# Patient Record
Sex: Female | Born: 1995
Health system: Southern US, Community
[De-identification: ages and names within clinical notes are randomized; demographics above are authoritative.]

## PROBLEM LIST (undated history)

## (undated) DIAGNOSIS — N946 Dysmenorrhea, unspecified: Secondary | ICD-10-CM

## (undated) HISTORY — PX: HERNIA REPAIR: SHX51

## (undated) HISTORY — DX: Dysmenorrhea, unspecified: N94.6

## (undated) HISTORY — PX: TONSILLECTOMY: SUR1361

---
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
## 2015-02-14 ENCOUNTER — Emergency Department (HOSPITAL_BASED_OUTPATIENT_CLINIC_OR_DEPARTMENT_OTHER)
Admission: EM | Admit: 2015-02-14 | Discharge: 2015-02-14 | Disposition: A | Discharge status: Home / Self Care | Attending: Emergency Medicine | Admitting: Emergency Medicine

## 2015-02-14 ENCOUNTER — Encounter (HOSPITAL_BASED_OUTPATIENT_CLINIC_OR_DEPARTMENT_OTHER): Payer: Self-pay | Admitting: *Deleted

## 2015-02-14 ENCOUNTER — Emergency Department (HOSPITAL_BASED_OUTPATIENT_CLINIC_OR_DEPARTMENT_OTHER)

## 2015-02-14 DIAGNOSIS — Z79899 Other long term (current) drug therapy: Secondary | ICD-10-CM | POA: Insufficient documentation

## 2015-02-14 DIAGNOSIS — S60221A Contusion of right hand, initial encounter: Secondary | ICD-10-CM | POA: Insufficient documentation

## 2015-02-14 DIAGNOSIS — Y9389 Activity, other specified: Secondary | ICD-10-CM | POA: Insufficient documentation

## 2015-02-14 DIAGNOSIS — Y92219 Unspecified school as the place of occurrence of the external cause: Secondary | ICD-10-CM | POA: Insufficient documentation

## 2015-02-14 DIAGNOSIS — W2203XA Walked into furniture, initial encounter: Secondary | ICD-10-CM | POA: Insufficient documentation

## 2015-02-14 DIAGNOSIS — Y998 Other external cause status: Secondary | ICD-10-CM | POA: Insufficient documentation

## 2015-02-14 DIAGNOSIS — S6991XA Unspecified injury of right wrist, hand and finger(s), initial encounter: Secondary | ICD-10-CM | POA: Diagnosis present

## 2015-02-14 NOTE — ED Notes (Signed)
Pt reports she hit right hand on desk yesterday while at school then fell on same hand last night while playing basketball

## 2015-02-14 NOTE — ED Notes (Signed)
Pt given ice pack and note for school

## 2015-02-14 NOTE — Discharge Instructions (Signed)
Contusion °A contusion is a deep bruise. Contusions are the result of an injury that caused bleeding under the skin. The contusion may turn blue, purple, or yellow. Minor injuries will give you a painless contusion, but more severe contusions may stay painful and swollen for a few weeks.  °CAUSES  °A contusion is usually caused by a blow, trauma, or direct force to an area of the body. °SYMPTOMS  °· Swelling and redness of the injured area. °· Bruising of the injured area. °· Tenderness and soreness of the injured area. °· Pain. °DIAGNOSIS  °The diagnosis can be made by taking a history and physical exam. An X-ray, CT scan, or MRI may be needed to determine if there were any associated injuries, such as fractures. °TREATMENT  °Specific treatment will depend on what area of the body was injured. In general, the best treatment for a contusion is resting, icing, elevating, and applying cold compresses to the injured area. Over-the-counter medicines may also be recommended for pain control. Ask your caregiver what the best treatment is for your contusion. °HOME CARE INSTRUCTIONS  °· Put ice on the injured area. °¨ Put ice in a plastic bag. °¨ Place a towel between your skin and the bag. °¨ Leave the ice on for 15-20 minutes, 3-4 times a day, or as directed by your health care provider. °· Only take over-the-counter or prescription medicines for pain, discomfort, or fever as directed by your caregiver. Your caregiver may recommend avoiding anti-inflammatory medicines (aspirin, ibuprofen, and naproxen) for 48 hours because these medicines may increase bruising. °· Rest the injured area. °· If possible, elevate the injured area to reduce swelling. °SEEK IMMEDIATE MEDICAL CARE IF:  °· You have increased bruising or swelling. °· You have pain that is getting worse. °· Your swelling or pain is not relieved with medicines. °MAKE SURE YOU:  °· Understand these instructions. °· Will watch your condition. °· Will get help right  away if you are not doing well or get worse. °Document Released: 07/30/2005 Document Revised: 10/25/2013 Document Reviewed: 08/25/2011 °ExitCare® Patient Information ©2015 [employer]. This information is not intended to replace advice given to you by your health care provider. Make sure you discuss any questions you have with your health care provider. ° °

## 2015-02-14 NOTE — ED Provider Notes (Signed)
CSN: [id number]     Arrival date & time 02/14/15  [id number] History  This chart was scribed for Glynn OctaveStephen Corbett Moulder, MD by Leone PayorSonum Patel, ED Scribe. This patient was seen in room MH01/MH01 and the patient's care was started 9:43 AM.    Chief Complaint  Patient presents with  . Hand Pain    HPI   HPI Comments: Cynthia Horton is a 19 y.o. female who presents to the Emergency Department complaining of constant, gradually worsened right hand pain that began yesterday. Patient states she hit her right hand on a desk at school and then fell on the same hand while playing basketball. She states the pain is worse with movement of the fingers. She has taken OTC pain medication without relief. She denies numbness, weakness, paresthesias, or any other injuries or pain at this time.   History reviewed. No pertinent past medical history. Past Surgical History  Procedure Laterality Date  . Hernia repair    . Tonsillectomy     Family History  Problem Relation Age of Onset  . Hypertension Mother   . Cancer Maternal Grandmother   . Breast cancer Maternal Grandmother   . Hypertension Maternal Grandfather   . Diabetes Maternal Grandfather   . Cancer Paternal Grandmother   . Breast cancer Paternal Grandmother    History  Substance Use Topics  . Smoking status: Never Smoker   . Smokeless tobacco: Never Used  . Alcohol Use: No   OB History    No data available     Review of Systems  A complete 10 system review of systems was obtained and all systems are negative except as noted in the HPI and PMH.    Allergies  Naproxen  Home Medications   Prior to Admission medications   Medication Sig Start Date End Date Taking? Authorizing Provider  albuterol (VENTOLIN HFA) 108 (90 BASE) MCG/ACT inhaler 2 inhalations prior to exercise 10/02/14  Yes Kendrick Rancheborah D Schoenhoff, MD  ibuprofen (ADVIL,MOTRIN) 800 MG tablet Take one tablet every 8 hours for painful menses 12/07/14  Yes Kendrick Rancheborah D Schoenhoff, MD   Norgestimate-Ethinyl Estradiol Triphasic 0.18/0.215/0.25 MG-25 MCG tab Take 1 tablet by mouth daily. 12/07/14  Yes Kendrick Rancheborah D Schoenhoff, MD  Levonorgestrel-Ethinyl Estradiol (AMETHIA,CAMRESE) 0.15-0.03 &0.01 MG tablet Take 1 tablet by mouth daily.    Historical Provider, MD   BP 111/72 mmHg  Pulse 99  Temp(Src) 98.2 F (36.8 C) (Oral)  Resp 18  Ht 5\' 7"  (1.702 m)  Wt 120 lb (54.432 kg)  BMI 18.79 kg/m2  SpO2 100%  LMP 02/04/2015 Physical Exam  Constitutional: She is oriented to person, place, and time. She appears well-developed and well-nourished. No distress.  HENT:  Head: Normocephalic and atraumatic.  Mouth/Throat: Oropharynx is clear and moist. No oropharyngeal exudate.  Eyes: Conjunctivae and EOM are normal. Pupils are equal, round, and reactive to light.  Neck: Normal range of motion. Neck supple.  No meningismus.  Cardiovascular: Normal rate, regular rhythm, normal heart sounds and intact distal pulses.   No murmur heard. Pulmonary/Chest: Effort normal and breath sounds normal. No respiratory distress.  Abdominal: Soft. There is no tenderness. There is no rebound and no guarding.  Musculoskeletal: Normal range of motion. She exhibits no edema or tenderness.  Right hand: Tenderness to the right 4th and 5th metacarpal. No deformity. Full ROM of all fingers at MCP, DIP, and PIP. No snuffbox tenderness. Intact radial pulse.   Neurological: She is alert and oriented to person, place, and time. No cranial nerve  deficit. She exhibits normal muscle tone. Coordination normal.  No ataxia on finger to nose bilaterally. No pronator drift. 5/5 strength throughout. CN 2-12 intact. Negative Romberg. Equal grip strength. Sensation intact. Gait is normal.   Skin: Skin is warm.  Psychiatric: She has a normal mood and affect. Her behavior is normal.  Nursing note and vitals reviewed.   ED Course  Procedures (including critical care time)  DIAGNOSTIC STUDIES: Oxygen Saturation is 100% on  RA, normal by my interpretation.    COORDINATION OF CARE: 9:50 AM Will order XRAY of right hand. Discussed treatment plan with pt at bedside and pt agreed to plan.  9:58 AM Discussed imaging results with patient; will discharge home   Labs Review Labs Reviewed - No data to display  Imaging Review Dg Hand Complete Right  02/14/2015   CLINICAL DATA:  Hand pain, hit hand on  desk yesterday at school  EXAM: RIGHT HAND - COMPLETE 3+ VIEW  COMPARISON:  None.  FINDINGS: Three views of the right hand submitted. No acute fracture or subluxation. No radiopaque foreign body.  IMPRESSION: Negative.   Electronically Signed   By: Natasha Mead M.D.   On: 02/14/2015 09:49     EKG Interpretation None      MDM   Final diagnoses:  Hand contusion, right, initial encounter   Pain to right lateral hand after hitting it on a desk yesterday. No weakness, numbness or tingling.  No deformity. Neurovascular intact. No open wounds. X-ray negative for fracture.  Discussed NSAIDs and supportive care with patient.   I personally performed the services described in this documentation, which was scribed in my presence. The recorded information has been reviewed and is accurate.    Glynn Octave, MD 02/14/15 1030

## 2015-03-01 ENCOUNTER — Telehealth: Payer: Self-pay | Admitting: *Deleted

## 2015-03-01 ENCOUNTER — Encounter: Payer: Self-pay | Admitting: Internal Medicine

## 2015-03-01 ENCOUNTER — Ambulatory Visit (INDEPENDENT_AMBULATORY_CARE_PROVIDER_SITE_OTHER): Admitting: Internal Medicine

## 2015-03-01 ENCOUNTER — Telehealth: Payer: Self-pay | Admitting: Internal Medicine

## 2015-03-01 VITALS — BP 95/61 | HR 73 | Resp 16 | Ht 66.0 in | Wt 118.0 lb

## 2015-03-01 DIAGNOSIS — N946 Dysmenorrhea, unspecified: Secondary | ICD-10-CM | POA: Diagnosis not present

## 2015-03-01 MED ORDER — LEVONORGEST-ETH ESTRAD 91-DAY 0.15-0.03 &0.01 MG PO TABS
1.0000 | ORAL_TABLET | Freq: Every day | ORAL | Status: DC
Start: 2015-03-01 — End: 2015-05-08

## 2015-03-01 NOTE — Progress Notes (Signed)
Subjective:    Patient ID: Cynthia Horton, female    DOB: 03/31/1996, 19 y.o.   MRN: [id number]  HPI 12/07/2014 note Assessment & Plan:  Menorrhagia Sounds as if pt not allowing herself to have a cycle. Will check CBC TSH today Refer to [facility]  Dysmenorrhea Will start low dose TRi-sprintec . Mother and pt educated risk/benefit including slight risk of DVT. Risk/Benefit handout given to pt. For now advised to have a monthly cycle until evaluated by GYN See me in 3 months  Advised if any leg swelling or calf pain to call office and will need ultrasound to evaluate          TODAY  Cynthia Horton is here for follow up.  She will be graduating and will start at [employer] in the fall   States she still has significant cramps on Tri-Sprintec  She has not been to GYN as yet although we have schedule this for her twice.   She would like to go back to [place].     She is a non-smoker   Allergies  Allergen Reactions  . Naproxen    No past medical history on file. Past Surgical History  Procedure Laterality Date  . Hernia repair    . Tonsillectomy     History   Social History  . Marital Status: Single    Spouse Name: N/A  . Number of Children: N/A  . Years of Education: N/A   Occupational History  . Not on file.   Social History Main Topics  . Smoking status: Never Smoker   . Smokeless tobacco: Never Used  . Alcohol Use: No  . Drug Use: No  . Sexual Activity: No   Other Topics Concern  . Not on file   Social History Narrative   Family History  Problem Relation Age of Onset  . Hypertension Mother   . Cancer Maternal Grandmother   . Breast cancer Maternal Grandmother   . Hypertension Maternal Grandfather   . Diabetes Maternal Grandfather   . Cancer Paternal Grandmother   . Breast cancer Paternal Grandmother    Patient Active Problem List   Diagnosis Date Noted  . Exercise-induced asthma 10/02/2014  . Dysmenorrhea 10/02/2014  . Anemia  10/02/2014   Current Outpatient Prescriptions on File Prior to Visit  Medication Sig Dispense Refill  . albuterol (VENTOLIN HFA) 108 (90 BASE) MCG/ACT inhaler 2 inhalations prior to exercise 1 Inhaler 1  . ibuprofen (ADVIL,MOTRIN) 800 MG tablet Take one tablet every 8 hours for painful menses 30 tablet 0  . Levonorgestrel-Ethinyl Estradiol (AMETHIA,CAMRESE) 0.15-0.03 &0.01 MG tablet Take 1 tablet by mouth daily.    . Norgestimate-Ethinyl Estradiol Triphasic 0.18/0.215/0.25 MG-25 MCG tab Take 1 tablet by mouth daily. 1 Package 4   No current facility-administered medications on file prior to visit.       Review of Systems    see hpi Objective:   Physical Exam  Physical Exam  Nursing note and vitals reviewed.  Constitutional: She is oriented to person, place, and time. She appears well-developed and well-nourished.  HENT:  Head: Normocephalic and atraumatic.  Cardiovascular: Normal rate and regular rhythm. Exam reveals no gallop and no friction rub.  No murmur heard.  Pulmonary/Chest: Breath sounds normal. She has no wheezes. She has no rales.  Neurological: She is alert and oriented to person, place, and time.  Skin: Skin is warm and dry.  Psychiatric: She has a normal mood and affect. Her behavior is normal.  Assessment & Plan:  Dysmenorrhea  /   Ok to go back to [employer].  ADvised safer sex practices    Advised pt to see gyn before moving to college .   Not clear why appt was not kept   Advised not to smoke Counseled if any leg swelling to go to urgent care.    She voices understanding  Pt has received letter of my departure and advised to make new appt with her GYN and to make appt with PCP of choice

## 2015-03-01 NOTE — Telephone Encounter (Signed)
Patient has an appointment with Dr. Loreta AveWagner at [facility] on 03/15/15 @ 3:30pm-eh

## 2015-03-01 NOTE — Telephone Encounter (Signed)
Please call pts mother Cynthia JanskyKathy Horton and advise her that Cynthia Horton is advised to see the GYN at [facility] prior to her moving away for college.    Document why they missed appt with GYn and give her phone number to [facility]

## 2015-05-08 ENCOUNTER — Other Ambulatory Visit: Payer: Self-pay | Admitting: Family Medicine

## 2015-05-08 MED ORDER — LEVONORGEST-ETH ESTRAD 91-DAY 0.15-0.03 &0.01 MG PO TABS: 1.0000 | ORAL_TABLET | Freq: Every day | ORAL | Status: DC

## 2015-05-08 NOTE — Telephone Encounter (Signed)
Refill sent to [facility]. Mother notified.

## 2015-05-08 NOTE — Telephone Encounter (Signed)
Pt transferring care from Dr. Constance GoltzSchoenhoff to Dr. Laury AxonLowne. She is out of birth control. Mother called requesting refill Levonorgestrel-Ethinyl Estradiol (AMETHIA,CAMRESE) 0.15-0.03 &0.01 MG tablet.  Pt is scheduled for new pt appt 05/21/15 9:30am.

## 2015-05-18 ENCOUNTER — Telehealth: Payer: Self-pay | Admitting: *Deleted

## 2015-05-18 NOTE — Telephone Encounter (Signed)
Unable to reach patient at time of Pre-Visit Call.  Left message for patient to return call when available.    

## 2015-05-21 ENCOUNTER — Other Ambulatory Visit (HOSPITAL_BASED_OUTPATIENT_CLINIC_OR_DEPARTMENT_OTHER): Payer: Self-pay | Admitting: Physician Assistant

## 2015-05-21 ENCOUNTER — Other Ambulatory Visit: Payer: Self-pay

## 2015-05-21 ENCOUNTER — Encounter: Payer: Self-pay | Admitting: Family Medicine

## 2015-05-21 ENCOUNTER — Other Ambulatory Visit: Payer: Self-pay | Admitting: Family Medicine

## 2015-05-21 ENCOUNTER — Ambulatory Visit (HOSPITAL_BASED_OUTPATIENT_CLINIC_OR_DEPARTMENT_OTHER)
Admission: RE | Admit: 2015-05-21 | Discharge: 2015-05-21 | Disposition: A | Discharge status: Home / Self Care | Source: Ambulatory Visit | Attending: Family Medicine | Admitting: Family Medicine

## 2015-05-21 ENCOUNTER — Ambulatory Visit (INDEPENDENT_AMBULATORY_CARE_PROVIDER_SITE_OTHER): Admitting: Family Medicine

## 2015-05-21 VITALS — BP 105/69 | HR 73 | Temp 97.4°F | Ht 67.0 in | Wt 121.4 lb

## 2015-05-21 DIAGNOSIS — R519 Headache, unspecified: Secondary | ICD-10-CM

## 2015-05-21 DIAGNOSIS — S0990XA Unspecified injury of head, initial encounter: Secondary | ICD-10-CM

## 2015-05-21 DIAGNOSIS — R51 Headache: Principal | ICD-10-CM

## 2015-05-21 DIAGNOSIS — Z30011 Encounter for initial prescription of contraceptive pills: Secondary | ICD-10-CM | POA: Diagnosis not present

## 2015-05-21 DIAGNOSIS — R42 Dizziness and giddiness: Secondary | ICD-10-CM | POA: Insufficient documentation

## 2015-05-21 DIAGNOSIS — G44319 Acute post-traumatic headache, not intractable: Secondary | ICD-10-CM | POA: Diagnosis not present

## 2015-05-21 DIAGNOSIS — I82401 Acute embolism and thrombosis of unspecified deep veins of right lower extremity: Secondary | ICD-10-CM

## 2015-05-21 LAB — POCT URINE PREGNANCY: PREG TEST UR: NEGATIVE

## 2015-05-21 MED ORDER — LEVONORGEST-ETH ESTRAD 91-DAY 0.15-0.03 &0.01 MG PO TABS: 1.0000 | ORAL_TABLET | Freq: Every day | ORAL | Status: DC

## 2015-05-21 NOTE — Progress Notes (Signed)
Pre visit review using our clinic review tool, if applicable. No additional management support is needed unless otherwise documented below in the visit note. 

## 2015-05-21 NOTE — Progress Notes (Signed)
Patient ID: Cynthia Horton, female    DOB: [birth year]  Age: 19 y.o. MRN: [id number]    Subjective:  Subjective HPI Cynthia Horton presents to establish and c/o hitting head about 2 weeks ago and has had headache since .  No loc. No NV. Headaches daily. Pt would also like to go back on her old bcp.  She does not like new one at all.  Review of Systems  Constitutional: Negative for diaphoresis, appetite change, fatigue and unexpected weight change.  Eyes: Negative for photophobia, pain, redness and visual disturbance.  Respiratory: Negative for cough, chest tightness, shortness of breath and wheezing.   Cardiovascular: Negative for chest pain, palpitations and leg swelling.  Endocrine: Negative for cold intolerance, heat intolerance, polydipsia, polyphagia and polyuria.  Genitourinary: Negative for dysuria, frequency and difficulty urinating.  Neurological: Positive for headaches. Negative for dizziness, tremors, syncope, weakness, light-headedness and numbness.  Psychiatric/Behavioral: Negative for decreased concentration. The patient is not nervous/anxious.     History Past Medical History  Diagnosis Date  . Dysmenorrhea in adolescent     She has past surgical history that includes Hernia repair and Tonsillectomy.   Her family history includes Breast cancer in her maternal grandmother and paternal grandmother; Diabetes in her maternal grandfather; Hypertension in her maternal grandfather and mother; Lung cancer in her paternal grandmother.She reports that she has never smoked. She has never used smokeless tobacco. She reports that she does not drink alcohol or use illicit drugs.  Current Outpatient Prescriptions on File Prior to Visit  Medication Sig Dispense Refill  . albuterol (VENTOLIN HFA) 108 (90 BASE) MCG/ACT inhaler 2 inhalations prior to exercise 1 Inhaler 1  . ibuprofen (ADVIL,MOTRIN) 800 MG tablet Take one tablet every 8 hours for painful menses 30 tablet 0   No  current facility-administered medications on file prior to visit.     Objective:  Objective Physical Exam  Constitutional: She is oriented to person, place, and time. She appears well-developed and well-nourished.  HENT:  Head: Normocephalic and atraumatic.  Eyes: Conjunctivae and EOM are normal.  Neck: Normal range of motion. Neck supple. No JVD present. Carotid bruit is not present. No thyromegaly present.  Cardiovascular: Normal rate, regular rhythm and normal heart sounds.   No murmur heard. Pulmonary/Chest: Effort normal and breath sounds normal. No respiratory distress. She has no wheezes. She has no rales. She exhibits no tenderness.  Musculoskeletal: She exhibits no edema.  Neurological: She is alert and oriented to person, place, and time.  Psychiatric: She has a normal mood and affect. Her behavior is normal. Judgment and thought content normal.   BP 105/69 mmHg  Pulse 73  Temp(Src) 97.4 F (36.3 C) (Oral)  Ht  (1.702 m)  Wt 121 lb 6.4 oz (55.067 kg)  BMI 19.01 kg/m2  SpO2 98%  LMP 05/17/2015 (Exact Date) Wt Readings from Last 3 Encounters:  05/21/15 121 lb 6.4 oz (55.067 kg) (41 %*, Z = -0.22)  03/01/15 118 lb (53.524 kg) (35 %*, Z = -0.38)  02/14/15 120 lb (54.432 kg) (40 %*, Z = -0.26)   * Growth percentiles are based on CDC 2-20 Years data.     Lab Results  Component Value Date   WBC 9.5 12/07/2014   HGB 14.1 12/07/2014   HCT 43.0 12/07/2014   PLT 283 12/07/2014   GLUCOSE 95 10/20/2014   CHOL 97 10/02/2014   TRIG 39 10/02/2014   HDL 49 10/02/2014   LDLCALC 40 10/02/2014   ALT 13 10/02/2014  AST 25 10/02/2014   NA 137 10/20/2014   K 3.8 10/20/2014   CL 100 10/20/2014   CREATININE 0.90 10/20/2014   BUN 13 10/20/2014   CO2 24 10/20/2014   TSH CANCELED 10/02/2014    Dg Hand Complete Right  02/14/2015   CLINICAL DATA:  Hand pain, hit hand on  desk yesterday at school  EXAM: RIGHT HAND - COMPLETE 3+ VIEW  COMPARISON:  None.  FINDINGS: Three  views of the right hand submitted. No acute fracture or subluxation. No radiopaque foreign body.  IMPRESSION: Negative.   Electronically Signed   By: Natasha MeadLiviu  Pop M.D.   On: 02/14/2015 09:49     Assessment & Plan:  Plan I have discontinued Ms. Derden's Levonorgestrel-Ethinyl Estradiol. I am also having her start on Levonorgestrel-Ethinyl Estradiol. Additionally, I am having her maintain her albuterol and ibuprofen.  Meds ordered this encounter  Medications  . Levonorgestrel-Ethinyl Estradiol (AMETHIA,CAMRESE) 0.15-0.03 &0.01 MG tablet    Sig: Take 1 tablet by mouth daily.    Dispense:  1 Package    Refill:  4    Problem List Items Addressed This Visit    None    Visit Diagnoses    Encounter for initial prescription of contraceptive pills    -  Primary    Relevant Medications    Levonorgestrel-Ethinyl Estradiol (AMETHIA,CAMRESE) 0.15-0.03 &0.01 MG tablet    Head injury, initial encounter        Relevant Orders    POCT urine pregnancy (Completed)    Acute post-traumatic headache, not intractable        Relevant Orders    Ambulatory referral to Neurology       Follow-up: No Follow-up on file.  Cynthia FreudYvonne Lowne, DO

## 2015-05-21 NOTE — Patient Instructions (Signed)
Traumatic Brain Injury Traumatic brain injury (TBI) occurs when an injury to the head causes the brain to move back and forth. The risk of brain injury varies with the severity of the trauma. The damage can be confined to one area of the brain (focal) or involve different areas of the brain (diffuse). The severity of a brain injury can range from:  A blow or jolt to the head that disrupts the normal function of the brain (concussion).  A deep state of unconsciousness (coma).  Death. CAUSES  A brain injury can result from:  A closed head injury. This occurs when the head suddenly and violently hits an object but the object does not break through the skull. Examples include:  A direct blow (hitting your head on a hard surface).  An indirect blow (when your head moves rapidly and violently back and forth, like in a car crash). This injury is called contrecoup (involving a blow and counter blow). Shaken baby syndrome is a severe type of this injury. It happens when a baby is shaken forcibly enough to cause extreme contrecoup injury.  Penetrating head injury. A penetrating head injury occurs when an object pierces the skull and enters the brain tissue. Examples include:  A skull fracture occurs when the skull cracks or breaks.  A depressed skull fracture occurs when pieces of the broken skull press into the tissue of the brain. This can cause bruising of the brain tissue called a contusion. In both closed and penetrating head injuries, damage to blood vessels can cause heavy bleeding into or around the brain.  SYMPTOMS  The symptoms of a TBI depend on the type and severity of the injury. The most common symptoms include:   Confusion (disorientation) or other thinking problems.  An inability to remember events around the time of the injury (amnesia).  Difficulty staying awake or passing out (loss of consciousness).  Difficulty maintaining your balance or feeling unsteady.  Slow reaction  time.  Difficulty learning or remembering things you have heard.  Headache.  Blurry vision.  Vomiting.  Seizures.  Swelling of the scalp. This occurs because of bleeding or swelling under the skin of the skull when the head is hit. TREATMENT Treatment of traumatic brain injury can involve a range of different medical options:  If a brain injury is moderate to severe, a hospital stay will be necessary to monitor:  Neurological status.  Pressure or swelling of the brain (intracranial pressure).  For seizures.  Severe brain injury cases may need surgery to:  Control bleeding.  Relieve pressure on the brain.  Remove objects from the brain that result from a penetrating injury.  Repair the skull from an injury.  Long-term treatment of a brain injury can involve rehabilitation work such as:  Physical therapy.  Occupational therapy.  Speech therapy. PROGNOSIS The outcome of TBI depends on the cause of the injury, location, severity, and extent of neurological damage. Outcomes range from good recovery to death. Long term consequences of a TBI can include:  Difficulty concentrating or having a short attention span.  Change in personality.  Irritability.  Headaches.  Blurry vision.  Sleepiness.  Depression.  Unsteadiness that makes walking or standing hard to do. For more information and support, contact: The National Institute of Neurological Disorders and Stroke.  Document Released: 10/10/2002 Document Revised: 08/10/2013 Document Reviewed: 02/02/2014 [employer] Patient Information 2015 [employer]. This information is not intended to replace advice given to you by your health care provider. Make sure   you discuss any questions you have with your health care provider.  

## 2015-06-07 ENCOUNTER — Emergency Department (HOSPITAL_BASED_OUTPATIENT_CLINIC_OR_DEPARTMENT_OTHER)

## 2015-06-07 ENCOUNTER — Emergency Department (HOSPITAL_BASED_OUTPATIENT_CLINIC_OR_DEPARTMENT_OTHER)
Admission: EM | Admit: 2015-06-07 | Discharge: 2015-06-07 | Disposition: A | Discharge status: Home / Self Care | Attending: Emergency Medicine | Admitting: Emergency Medicine

## 2015-06-07 ENCOUNTER — Encounter (HOSPITAL_BASED_OUTPATIENT_CLINIC_OR_DEPARTMENT_OTHER): Payer: Self-pay | Admitting: *Deleted

## 2015-06-07 DIAGNOSIS — Z3202 Encounter for pregnancy test, result negative: Secondary | ICD-10-CM | POA: Insufficient documentation

## 2015-06-07 DIAGNOSIS — J45901 Unspecified asthma with (acute) exacerbation: Secondary | ICD-10-CM

## 2015-06-07 DIAGNOSIS — Z79899 Other long term (current) drug therapy: Secondary | ICD-10-CM | POA: Diagnosis not present

## 2015-06-07 DIAGNOSIS — Z8742 Personal history of other diseases of the female genital tract: Secondary | ICD-10-CM | POA: Diagnosis not present

## 2015-06-07 DIAGNOSIS — R05 Cough: Secondary | ICD-10-CM

## 2015-06-07 DIAGNOSIS — R0981 Nasal congestion: Secondary | ICD-10-CM

## 2015-06-07 DIAGNOSIS — R059 Cough, unspecified: Secondary | ICD-10-CM

## 2015-06-07 LAB — PREGNANCY, URINE: PREG TEST UR: NEGATIVE

## 2015-06-07 MED ORDER — BENZONATATE 100 MG PO CAPS: 100.0000 mg | ORAL_CAPSULE | Freq: Three times a day (TID) | ORAL | Status: DC | PRN

## 2015-06-07 MED ORDER — PREDNISONE 20 MG PO TABS: 60.0000 mg | ORAL_TABLET | Freq: Every day | ORAL | Status: DC

## 2015-06-07 MED ORDER — ALBUTEROL SULFATE HFA 108 (90 BASE) MCG/ACT IN AERS: 2.0000 | INHALATION_SPRAY | RESPIRATORY_TRACT | Status: DC | PRN

## 2015-06-07 MED ORDER — ACETAMINOPHEN 325 MG PO TABS
650.0000 mg | ORAL_TABLET | Freq: Once | ORAL | Status: AC
  Administered 2015-06-07: 650 mg via ORAL
  Filled 2015-06-07: qty 2

## 2015-06-07 MED ORDER — DIPHENHYDRAMINE HCL 25 MG PO CAPS
50.0000 mg | ORAL_CAPSULE | Freq: Once | ORAL | Status: AC
  Administered 2015-06-07: 50 mg via ORAL
  Filled 2015-06-07: qty 2

## 2015-06-07 MED ORDER — PREDNISONE 50 MG PO TABS
60.0000 mg | ORAL_TABLET | Freq: Once | ORAL | Status: AC
  Administered 2015-06-07: 60 mg via ORAL
  Filled 2015-06-07 (×2): qty 1

## 2015-06-07 MED ORDER — BENZONATATE 100 MG PO CAPS
100.0000 mg | ORAL_CAPSULE | Freq: Once | ORAL | Status: AC
  Administered 2015-06-07: 100 mg via ORAL
  Filled 2015-06-07: qty 1

## 2015-06-07 NOTE — Discharge Instructions (Signed)
You may alternate between 650 mg of Tylenol every 6 hours as needed for fever and pain and 600 mg of ibuprofen every 8 hours as needed for fever and pain. You may also use Benadryl 50 mg every 8 hours as needed for nasal congestion and to help with sleep. Please rest, drink plenty of fluids. I do not feel antibiotics will be helpful at this time - there is no sign of pneumonia or other bacterial infection. If you do not notice significant improvement after 48 hours of being on steroids, I recommend you follow-up with your primary care physician.  Asthma, Acute Bronchospasm Acute bronchospasm caused by asthma is also referred to as an asthma attack. Bronchospasm means your air passages become narrowed. The narrowing is caused by inflammation and tightening of the muscles in the air tubes (bronchi) in your lungs. This can make it hard to breathe or cause you to wheeze and cough. CAUSES Possible triggers are:  Animal dander from the skin, hair, or feathers of animals.  Dust mites contained in house dust.  Cockroaches.  Pollen from trees or grass.  Mold.  Cigarette or tobacco smoke.  Air pollutants such as dust, household cleaners, hair sprays, aerosol sprays, paint fumes, strong chemicals, or strong odors.  Cold air or weather changes. Cold air may trigger inflammation. Winds increase molds and pollens in the air.  Strong emotions such as crying or laughing hard.  Stress.  Certain medicines such as aspirin or beta-blockers.  Sulfites in foods and drinks, such as dried fruits and wine.  Infections or inflammatory conditions, such as a flu, cold, or inflammation of the nasal membranes (rhinitis).  Gastroesophageal reflux disease (GERD). GERD is a condition where stomach acid backs up into your esophagus.  Exercise or strenuous activity. SIGNS AND SYMPTOMS   Wheezing.  Excessive coughing, particularly at night.  Chest tightness.  Shortness of breath. DIAGNOSIS  Your health  care provider will ask you about your medical history and perform a physical exam. A chest X-ray or blood testing may be performed to look for other causes of your symptoms or other conditions that may have triggered your asthma attack. TREATMENT  Treatment is aimed at reducing inflammation and opening up the airways in your lungs. Most asthma attacks are treated with inhaled medicines. These include quick relief or rescue medicines (such as bronchodilators) and controller medicines (such as inhaled corticosteroids). These medicines are sometimes given through an inhaler or a nebulizer. Systemic steroid medicine taken by mouth or given through an IV tube also can be used to reduce the inflammation when an attack is moderate or severe. Antibiotic medicines are only used if a bacterial infection is present.  HOME CARE INSTRUCTIONS   Rest.  Drink plenty of liquids. This helps the mucus to remain thin and be easily coughed up. Only use caffeine in moderation and do not use alcohol until you have recovered from your illness.  Do not smoke. Avoid being exposed to secondhand smoke.  You play a critical role in keeping yourself in good health. Avoid exposure to things that cause you to wheeze or to have breathing problems.  Keep your medicines up-to-date and available. Carefully follow your health care provider's treatment plan.  Take your medicine exactly as prescribed.  When pollen or pollution is bad, keep windows closed and use an air conditioner or go to places with air conditioning.  Asthma requires careful medical care. See your health care provider for a follow-up as advised. If you are more  than [redacted] weeks pregnant and you were prescribed any new medicines, let your obstetrician know about the visit and how you are doing. Follow up with your health care provider as directed.  After you have recovered from your asthma attack, make an appointment with your outpatient doctor to talk about ways to  reduce the likelihood of future attacks. If you do not have a doctor who manages your asthma, make an appointment with a primary care doctor to discuss your asthma. SEEK IMMEDIATE MEDICAL CARE IF:   You are getting worse.  You have trouble breathing. If severe, call your local emergency services (911 in the [place]  You develop chest pain or discomfort.  You are vomiting.  You are not able to keep fluids down.  You are coughing up yellow, green, brown, or bloody sputum.  You have a fever and your symptoms suddenly get worse.  You have trouble swallowing. MAKE SURE YOU:   Understand these instructions.  Will watch your condition.  Will get help right away if you are not doing well or get worse. Document Released: 02/04/2007 Document Revised: 10/25/2013 Document Reviewed: 04/27/2013 [facility] Patient Information 2015 [place], [place]. This information is not intended to replace advice given to you by your health care provider. Make sure you discuss any questions you have with your health care provider.  Upper Respiratory Infection, Adult An upper respiratory infection (URI) is also sometimes known as the common cold. The upper respiratory tract includes the nose, sinuses, throat, trachea, and bronchi. Bronchi are the airways leading to the lungs. Most people improve within 1 week, but symptoms can last up to 2 weeks. A residual cough may last even longer.  CAUSES Many different viruses can infect the tissues lining the upper respiratory tract. The tissues become irritated and inflamed and often become very moist. Mucus production is also common. A cold is contagious. You can easily spread the virus to others by oral contact. This includes kissing, sharing a glass, coughing, or sneezing. Touching your mouth or nose and then touching a surface, which is then touched by another person, can also spread the virus. SYMPTOMS  Symptoms typically develop 1 to 3 days after you come in contact with  a cold virus. Symptoms vary from person to person. They may include:  Runny nose.  Sneezing.  Nasal congestion.  Sinus irritation.  Sore throat.  Loss of voice (laryngitis).  Cough.  Fatigue.  Muscle aches.  Loss of appetite.  Headache.  Low-grade fever. DIAGNOSIS  You might diagnose your own cold based on familiar symptoms, since most people get a cold 2 to 3 times a year. Your caregiver can confirm this based on your exam. Most importantly, your caregiver can check that your symptoms are not due to another disease such as strep throat, sinusitis, pneumonia, asthma, or epiglottitis. Blood tests, throat tests, and X-rays are not necessary to diagnose a common cold, but they may sometimes be helpful in excluding other more serious diseases. Your caregiver will decide if any further tests are required. RISKS AND COMPLICATIONS  You may be at risk for a more severe case of the common cold if you smoke cigarettes, have chronic heart disease (such as heart failure) or lung disease (such as asthma), or if you have a weakened immune system. The very young and very old are also at risk for more serious infections. Bacterial sinusitis, middle ear infections, and bacterial pneumonia can complicate the common cold. The common cold can worsen asthma and chronic obstructive  pulmonary disease (COPD). Sometimes, these complications can require emergency medical care and may be life-threatening. PREVENTION  The best way to protect against getting a cold is to practice good hygiene. Avoid oral or hand contact with people with cold symptoms. Wash your hands often if contact occurs. There is no clear evidence that vitamin C, vitamin E, echinacea, or exercise reduces the chance of developing a cold. However, it is always recommended to get plenty of rest and practice good nutrition. TREATMENT  Treatment is directed at relieving symptoms. There is no cure. Antibiotics are not effective, because the  infection is caused by a virus, not by bacteria. Treatment may include:  Increased fluid intake. Sports drinks offer valuable electrolytes, sugars, and fluids.  Breathing heated mist or steam (vaporizer or shower).  Eating chicken soup or other clear broths, and maintaining good nutrition.  Getting plenty of rest.  Using gargles or lozenges for comfort.  Controlling fevers with ibuprofen or acetaminophen as directed by your caregiver.  Increasing usage of your inhaler if you have asthma. Zinc gel and zinc lozenges, taken in the first 24 hours of the common cold, can shorten the duration and lessen the severity of symptoms. Pain medicines may help with fever, muscle aches, and throat pain. A variety of non-prescription medicines are available to treat congestion and runny nose. Your caregiver can make recommendations and may suggest nasal or lung inhalers for other symptoms.  HOME CARE INSTRUCTIONS   Only take over-the-counter or prescription medicines for pain, discomfort, or fever as directed by your caregiver.  Use a warm mist humidifier or inhale steam from a shower to increase air moisture. This may keep secretions moist and make it easier to breathe.  Drink enough water and fluids to keep your urine clear or pale yellow.  Rest as needed.  Return to work when your temperature has returned to normal or as your caregiver advises. You may need to stay home longer to avoid infecting others. You can also use a face mask and careful hand washing to prevent spread of the virus. SEEK MEDICAL CARE IF:   After the first few days, you feel you are getting worse rather than better.  You need your caregiver's advice about medicines to control symptoms.  You develop chills, worsening shortness of breath, or brown or red sputum. These may be signs of pneumonia.  You develop yellow or brown nasal discharge or pain in the face, especially when you bend forward. These may be signs of  sinusitis.  You develop a fever, swollen neck glands, pain with swallowing, or white areas in the back of your throat. These may be signs of strep throat. SEEK IMMEDIATE MEDICAL CARE IF:   You have a fever.  You develop severe or persistent headache, ear pain, sinus pain, or chest pain.  You develop wheezing, a prolonged cough, cough up blood, or have a change in your usual mucus (if you have chronic lung disease).  You develop sore muscles or a stiff neck. Document Released: 04/15/2001 Document Revised: 01/12/2012 Document Reviewed: 01/25/2014 [facility] Patient Information 2015 [place], [place]. This information is not intended to replace advice given to you by your health care provider. Make sure you discuss any questions you have with your health care provider.

## 2015-06-07 NOTE — ED Notes (Signed)
C/o cough, runny nose, congestion x 4 days

## 2015-06-07 NOTE — ED Provider Notes (Signed)
TIME SEEN: 1:20 AM  CHIEF COMPLAINT: Nasal congestion, cough, wheezing  HPI: Pt is a 19 y.o. female with history of asthma who presents to the emergency department with 4 days of nasal congestion, dry cough. No fever or chills. Mother reports patient will become short of breath because she has long coughing spells. No vomiting or diarrhea. No sick contacts or recent travel. No history of PE or DVT. She is on birth control but does not smoke. No history of lower extremity swelling or pain. No recent prolonged immobilization such as long flight, hospitalization, fracture, surgery or trauma. Mother reports she has been wheezing and have been using albuterol home with some relief. Asthma is normally triggered by exercise.  ROS: See HPI Constitutional: no fever  Eyes: no drainage  ENT: no runny nose   Cardiovascular:  no chest pain  Resp:  SOB with coughing GI: no vomiting GU: no dysuria Integumentary: no rash  Allergy: no hives  Musculoskeletal: no leg swelling  Neurological: no slurred speech ROS otherwise negative  PAST MEDICAL HISTORY/PAST SURGICAL HISTORY:  Past Medical History  Diagnosis Date  . Dysmenorrhea in adolescent     MEDICATIONS:  Prior to Admission medications   Medication Sig Start Date End Date Taking? Authorizing Provider  albuterol (VENTOLIN HFA) 108 (90 BASE) MCG/ACT inhaler 2 inhalations prior to exercise 10/02/14   Kendrick Ranch, MD  ibuprofen (ADVIL,MOTRIN) 800 MG tablet Take one tablet every 8 hours for painful menses 12/07/14   Kendrick Ranch, MD  Levonorgestrel-Ethinyl Estradiol (AMETHIA,CAMRESE) 0.15-0.03 &0.01 MG tablet Take 1 tablet by mouth daily. 05/21/15   Lelon Perla, DO    ALLERGIES:  Allergies  Allergen Reactions  . Naproxen Other (See Comments)    Chest tightness    SOCIAL HISTORY:  History  Substance Use Topics  . Smoking status: Never Smoker   . Smokeless tobacco: Never Used  . Alcohol Use: No    FAMILY HISTORY: Family  History  Problem Relation Age of Onset  . Hypertension Mother   . Breast cancer Maternal Grandmother     x's 3  . Hypertension Maternal Grandfather   . Diabetes Maternal Grandfather   . Breast cancer Paternal Grandmother   . Lung cancer Paternal Grandmother     EXAM: BP 129/87 mmHg  Pulse 93  Temp(Src) 98.4 F (36.9 C) (Oral)  Resp 20  Ht 5\' 7"  (1.702 m)  Wt 128 lb (58.06 kg)  BMI 20.04 kg/m2  SpO2 100%  LMP 05/17/2015 (Exact Date) CONSTITUTIONAL: Alert and oriented and responds appropriately to questions. Well-appearing; well-nourished afebrile, in no distress HEAD: Normocephalic EYES: Conjunctivae clear, PERRL ENT: normal nose; no rhinorrhea; moist mucous membranes; pharynx without lesions noted, clear nasal drainage noted in the posterior oropharynx, no tonsillar hypertrophy or exudate, no uvular deviation, no trismus or drooling, normal phonation, no stridor NECK: Supple, no meningismus, no LAD  CARD: RRR; S1 and S2 appreciated; no murmurs, no clicks, no rubs, no gallops; patient initially tachycardic but this was taken when patient was coughing RESP: Normal chest excursion without splinting or tachypnea; breath sounds clear and equal bilaterally; no wheezes, no rhonchi, no rales, no hypoxia or respiratory distress, speaking full sentences, has dry cough ABD/GI: Normal bowel sounds; non-distended; soft, non-tender, no rebound, no guarding, no peritoneal signs BACK:  The back appears normal and is non-tender to palpation, there is no CVA tenderness EXT: Normal ROM in all joints; non-tender to palpation; no edema; normal capillary refill; no cyanosis, no calf tenderness or  swelling    SKIN: Normal color for age and race; warm NEURO: Moves all extremities equally, sensation to light touch intact diffusely, cranial nerves II through XII intact PSYCH: The patient's mood and manner are appropriate. Grooming and personal hygiene are appropriate.  MEDICAL DECISION MAKING: Patient here  with cough, nasal congestion. There may be a component of postnasal drip given patient has clear nasal sinus drainage in her posterior oropharynx as well as bronchospasm given her history of asthma and reports a wheezing at home. Her lungs are now clear to auscultation with good aeration she is not hypoxic. She does not have any risk factors for PE other than birth control use but only feel short of breath when she is coughing heavily. No chest pain. No lower extremity swelling or pain. We'll refill her albuterol inhaler and start her on a prednisone burst. Will also discharge with Tessalon Perles to use for coughing. Have recommended alternating Tylenol and Motrin if patient does develop fever or has pain. Have recommended rest, increase fluid intake. Will provide work note. Chest x-ray today is normal without edema, pneumothorax or infiltrate. She is not pregnant. Discussed usual and customary return precautions. Patient and family at bedside verbalize understanding and are comfortable with this plan.      [name], DO 06/07/15 0221

## 2015-06-07 NOTE — ED Notes (Signed)
C/o cough, congestion and sob w cough x 4 days  No distress noted

## 2015-06-18 ENCOUNTER — Encounter: Admitting: Family Medicine

## 2015-06-18 ENCOUNTER — Telehealth: Payer: Self-pay | Admitting: Family Medicine

## 2015-06-22 NOTE — Telephone Encounter (Signed)
yes

## 2015-06-22 NOTE — Telephone Encounter (Signed)
Pt was no show 06/18/15 1:30pm, cpe appt, called and left msg to reschedule, charge for no show?

## 2015-07-12 ENCOUNTER — Encounter: Payer: Self-pay | Admitting: Neurology

## 2015-07-12 ENCOUNTER — Ambulatory Visit (INDEPENDENT_AMBULATORY_CARE_PROVIDER_SITE_OTHER): Admitting: Neurology

## 2015-07-12 VITALS — BP 104/64 | HR 93 | Ht 67.0 in | Wt 123.2 lb

## 2015-07-12 DIAGNOSIS — G44309 Post-traumatic headache, unspecified, not intractable: Secondary | ICD-10-CM | POA: Diagnosis not present

## 2015-07-12 NOTE — Progress Notes (Signed)
NEUROLOGY CONSULTATION NOTE  Cynthia Horton MRN: [id number] DOB: [birth year]  Referring provider: Dr. Laury Axon Primary care provider: Dr. Laury Axon  Reason for consult:  headache  HISTORY OF PRESENT ILLNESS: Cynthia Horton is an 19 year old right-handed female who presents for headache.  She is accompanied by her mother who provides some history.  Images of head CT reviewed.  In July, she hit the left side of her forehead, just above the the eye on a bar while on a slide at the park.  She did not lose consciousness.  Afterwards, she developed tenderness to the area.  Light touch and rubbing of her eyebrow is uncomfortable.  Occasionally, she has a headache, located in the same region, about 6/10 and lasting 5 to 10 minutes.  It occurs about once or twice a week.  There is no associated nausea, vomiting or visual disturbance.  She is photosensitive.  She doesn't take anything for the pain.  It has slowly improved but has not resolved.  She had a CT of the head performed on 05/21/15, which was normal.    She has no prior history of headache.  However, she did hit the same area of her head in 5th grade.  There is no family history of headache or cerebral aneurysm.  PAST MEDICAL HISTORY: Past Medical History  Diagnosis Date  . Dysmenorrhea in adolescent     PAST SURGICAL HISTORY: Past Surgical History  Procedure Laterality Date  . Hernia repair    . Tonsillectomy      MEDICATIONS: Current Outpatient Prescriptions on File Prior to Visit  Medication Sig Dispense Refill  . albuterol (PROVENTIL HFA;VENTOLIN HFA) 108 (90 BASE) MCG/ACT inhaler Inhale 2 puffs into the lungs every 4 (four) hours as needed for wheezing or shortness of breath. 1 Inhaler 0  . ibuprofen (ADVIL,MOTRIN) 800 MG tablet Take one tablet every 8 hours for painful menses 30 tablet 0  . Levonorgestrel-Ethinyl Estradiol (AMETHIA,CAMRESE) 0.15-0.03 &0.01 MG tablet Take 1 tablet by mouth daily. 1 Package 4   No current  facility-administered medications on file prior to visit.    ALLERGIES: Allergies  Allergen Reactions  . Naproxen Other (See Comments)    Chest tightness    FAMILY HISTORY: Family History  Problem Relation Age of Onset  . Hypertension Mother   . Breast cancer Maternal Grandmother     x's 3  . Hypertension Maternal Grandfather   . Diabetes Maternal Grandfather   . Breast cancer Paternal Grandmother   . Lung cancer Paternal Grandmother     SOCIAL HISTORY: Social History   Social History  . Marital Status: Single    Spouse Name: N/A  . Number of Children: N/A  . Years of Education: N/A   Occupational History  . Not on file.   Social History Main Topics  . Smoking status: Never Smoker   . Smokeless tobacco: Never Used  . Alcohol Use: No  . Drug Use: No  . Sexual Activity: No   Other Topics Concern  . Not on file   Social History Narrative   Lives with mother in a two story home.  No children.  Works as a Higher education careers adviser at [employer].  Education: in college.    REVIEW OF SYSTEMS: Constitutional: No fevers, chills, or sweats, no generalized fatigue, change in appetite Eyes: No visual changes, double vision, eye pain Ear, nose and throat: No hearing loss, ear pain, nasal congestion, sore throat Cardiovascular: No chest pain, palpitations Respiratory:  No shortness  of breath at rest or with exertion, wheezes GastrointestinaI: No nausea, vomiting, diarrhea, abdominal pain, fecal incontinence Genitourinary:  No dysuria, urinary retention or frequency Musculoskeletal:  No neck pain, back pain Integumentary: No rash, pruritus, skin lesions Neurological: as above Psychiatric: No depression, insomnia, anxiety Endocrine: No palpitations, fatigue, diaphoresis, mood swings, change in appetite, change in weight, increased thirst Hematologic/Lymphatic:  No anemia, purpura, petechiae. Allergic/Immunologic: no itchy/runny eyes, nasal congestion, recent allergic  reactions, rashes  PHYSICAL EXAM: Filed Vitals:   07/12/15 1333  BP: 104/64  Pulse: 93   General: No acute distress.  Patient appears well-groomed.   Head:  Normocephalic/atraumatic.  Tenderness to palpation to the left temple just above her left eye. Eyes:  fundi unremarkable, without vessel changes, exudates, hemorrhages or papilledema. Neck: supple, no paraspinal tenderness, full range of motion Back: No paraspinal tenderness Heart: regular rate and rhythm Lungs: Clear to auscultation bilaterally. Vascular: No carotid bruits. Neurological Exam: Mental status: alert and oriented to person, place, and time, recent and remote memory intact, fund of knowledge intact, attention and concentration intact, speech fluent and not dysarthric, language intact. Cranial nerves: CN I: not tested CN II: pupils equal, round and reactive to light, visual fields intact, fundi unremarkable, without vessel changes, exudates, hemorrhages or papilledema. CN III, IV, VI:  full range of motion, no nystagmus, no ptosis CN V: facial sensation intact CN VII: upper and lower face symmetric CN VIII: hearing intact CN IX, X: gag intact, uvula midline CN XI: sternocleidomastoid and trapezius muscles intact CN XII: tongue midline Bulk & Tone: normal, no fasciculations. Motor:  5/5 throughout Sensation: temperature and vibration sensation intact. Deep Tendon Reflexes:  2+ throughout, toes downgoing.  Finger to nose testing:  Without dysmetria.  Heel to shin:  Without dysmetria.  Gait:  Normal station and stride.  Able to turn and tandem walk. Romberg negative.  IMPRESSION: Post-traumatic headache.  Improving.  Neurological exam normal.   PLAN: If she felt they were frequent enough, we can start an antidepressant.  However, she is not bothered by it and it is slowly improving.  Follow up as needed.  Thank you for allowing me to take part in the care of this patient.  Shon Millet, DO  CC:  Loreen Freud,  DO

## 2015-07-12 NOTE — Patient Instructions (Signed)
CT of the head looks okay.  I think the tenderness will continue to improve with time.  Follow up as needed.

## 2016-04-14 ENCOUNTER — Ambulatory Visit (INDEPENDENT_AMBULATORY_CARE_PROVIDER_SITE_OTHER): Admitting: Family Medicine

## 2016-04-14 ENCOUNTER — Encounter: Payer: Self-pay | Admitting: Family Medicine

## 2016-04-14 VITALS — BP 90/50 | HR 86 | Temp 98.4°F | Ht 66.0 in | Wt 123.4 lb

## 2016-04-14 DIAGNOSIS — Z30011 Encounter for initial prescription of contraceptive pills: Secondary | ICD-10-CM | POA: Diagnosis not present

## 2016-04-14 MED ORDER — LEVONORGEST-ETH ESTRAD 91-DAY 0.15-0.03 &0.01 MG PO TABS: 1.0000 | ORAL_TABLET | Freq: Every day | ORAL | Status: DC

## 2016-04-14 NOTE — Progress Notes (Signed)
Pre visit review using our clinic review tool, if applicable. No additional management support is needed unless otherwise documented below in the visit note. 

## 2016-04-14 NOTE — Patient Instructions (Signed)

## 2016-04-14 NOTE — Progress Notes (Signed)
Subjective:    Patient ID: Cynthia Horton, female    DOB: 1996-09-12, 20 y.o.   MRN: [id number]  Chief Complaint  Patient presents with  . Medication check    and refill    HPI Patient is in today for f/u bcp.   No complaints.  bcp are controlling cramps.    Pt is not sexually active.     Past Medical History  Diagnosis Date  . Dysmenorrhea in adolescent     Past Surgical History  Procedure Laterality Date  . Hernia repair    . Tonsillectomy      Family History  Problem Relation Age of Onset  . Hypertension Mother   . Breast cancer Maternal Grandmother     x's 3  . Hypertension Maternal Grandfather   . Diabetes Maternal Grandfather   . Breast cancer Paternal Grandmother   . Lung cancer Paternal Grandmother     Social History   Social History  . Marital Status: Single    Spouse Name: N/A  . Number of Children: N/A  . Years of Education: N/A   Occupational History  . Not on file.   Social History Main Topics  . Smoking status: Never Smoker   . Smokeless tobacco: Never Used  . Alcohol Use: No  . Drug Use: No  . Sexual Activity: No   Other Topics Concern  . Not on file   Social History Narrative   Lives with mother in a two story home.  No children.  Works as a Higher education careers adviser at [employer].  Education: in college.    Outpatient Prescriptions Prior to Visit  Medication Sig Dispense Refill  . albuterol (PROVENTIL HFA;VENTOLIN HFA) 108 (90 BASE) MCG/ACT inhaler Inhale 2 puffs into the lungs every 4 (four) hours as needed for wheezing or shortness of breath. 1 Inhaler 0  . ibuprofen (ADVIL,MOTRIN) 800 MG tablet Take one tablet every 8 hours for painful menses 30 tablet 0  . Levonorgestrel-Ethinyl Estradiol (AMETHIA,CAMRESE) 0.15-0.03 &0.01 MG tablet Take 1 tablet by mouth daily. 1 Package 4   No facility-administered medications prior to visit.    Allergies  Allergen Reactions  . Naproxen Other (See Comments)    Chest tightness     Review of Systems  Constitutional: Negative for fever, chills and malaise/fatigue.  HENT: Negative for congestion and hearing loss.   Eyes: Negative for discharge.  Respiratory: Negative for cough, sputum production and shortness of breath.   Cardiovascular: Negative for chest pain, palpitations and leg swelling.  Gastrointestinal: Negative for heartburn, nausea, vomiting, abdominal pain, diarrhea, constipation and blood in stool.  Genitourinary: Negative for dysuria, urgency, frequency and hematuria.  Musculoskeletal: Negative for myalgias, back pain and falls.  Skin: Negative for rash.  Neurological: Negative for dizziness, sensory change, loss of consciousness, weakness and headaches.  Endo/Heme/Allergies: Negative for environmental allergies. Does not bruise/bleed easily.  Psychiatric/Behavioral: Negative for depression and suicidal ideas. The patient is not nervous/anxious and does not have insomnia.        Objective:    Physical Exam  Constitutional: She is oriented to person, place, and time. She appears well-developed and well-nourished.  HENT:  Head: Normocephalic and atraumatic.  Eyes: Conjunctivae and EOM are normal.  Neck: Normal range of motion. Neck supple. No JVD present. Carotid bruit is not present. No thyromegaly present.  Cardiovascular: Normal rate, regular rhythm and normal heart sounds.   No murmur heard. Pulmonary/Chest: Effort normal and breath sounds normal. No respiratory distress. She has no  wheezes. She has no rales. She exhibits no tenderness.  Musculoskeletal: She exhibits no edema.  Neurological: She is alert and oriented to person, place, and time.  Psychiatric: She has a normal mood and affect.  Nursing note and vitals reviewed.   BP 90/50 mmHg  Pulse 86  Temp(Src) 98.4 F (36.9 C) (Oral)  Ht 5\' 6"  (1.676 m)  Wt 123 lb 6.4 oz (55.974 kg)  BMI 19.93 kg/m2  SpO2 98%  LMP 03/27/2016 Wt Readings from Last 3 Encounters:  04/14/16 123 lb 6.4  oz (55.974 kg) (41 %*, Z = -0.22)  07/12/15 123 lb 4 oz (55.906 kg) (45 %*, Z = -0.14)  06/07/15 128 lb (58.06 kg) (54 %*, Z = 0.11)   * Growth percentiles are based on CDC 2-20 Years data.     Lab Results  Component Value Date   WBC 9.5 12/07/2014   HGB 14.1 12/07/2014   HCT 43.0 12/07/2014   PLT 283 12/07/2014   GLUCOSE 95 10/20/2014   CHOL 97 10/02/2014   TRIG 39 10/02/2014   HDL 49 10/02/2014   LDLCALC 40 10/02/2014   ALT 13 10/02/2014   AST 25 10/02/2014   NA 137 10/20/2014   K 3.8 10/20/2014   CL 100 10/20/2014   CREATININE 0.90 10/20/2014   BUN 13 10/20/2014   CO2 24 10/20/2014   TSH CANCELED 10/02/2014    Lab Results  Component Value Date   TSH CANCELED 10/02/2014   Lab Results  Component Value Date   WBC 9.5 12/07/2014   HGB 14.1 12/07/2014   HCT 43.0 12/07/2014   MCV 89.8 12/07/2014   PLT 283 12/07/2014   Lab Results  Component Value Date   NA 137 10/20/2014   K 3.8 10/20/2014   CO2 24 10/20/2014   GLUCOSE 95 10/20/2014   BUN 13 10/20/2014   CREATININE 0.90 10/20/2014   BILITOT 0.4 10/02/2014   ALKPHOS 57 10/02/2014   AST 25 10/02/2014   ALT 13 10/02/2014   PROT 6.9 10/02/2014   ALBUMIN 3.9 10/02/2014   CALCIUM 8.5 10/20/2014   ANIONGAP 13 10/20/2014   Lab Results  Component Value Date   CHOL 97 10/02/2014   Lab Results  Component Value Date   HDL 49 10/02/2014   Lab Results  Component Value Date   LDLCALC 40 10/02/2014   Lab Results  Component Value Date   TRIG 39 10/02/2014   Lab Results  Component Value Date   CHOLHDL 2.0 10/02/2014   No results found for: HGBA1C     Assessment & Plan:   Problem List Items Addressed This Visit    None    Visit Diagnoses    Encounter for initial prescription of contraceptive pills    -  Primary    Relevant Medications    Levonorgestrel-Ethinyl Estradiol (AMETHIA,CAMRESE) 0.15-0.03 &0.01 MG tablet       I am having Ms. Kuechle maintain her ibuprofen, albuterol, and  Levonorgestrel-Ethinyl Estradiol.  Meds ordered this encounter  Medications  . DISCONTD: Levonorgestrel-Ethinyl Estradiol (AMETHIA,CAMRESE) 0.15-0.03 &0.01 MG tablet    Sig: Take 1 tablet by mouth daily.    Dispense:  1 Package    Refill:  1  . Levonorgestrel-Ethinyl Estradiol (AMETHIA,CAMRESE) 0.15-0.03 &0.01 MG tablet    Sig: Take 1 tablet by mouth daily.    Dispense:  1 Package    Refill:  3     Donato SchultzYvonne R Lowne Chase, DO

## 2016-06-14 ENCOUNTER — Emergency Department (HOSPITAL_BASED_OUTPATIENT_CLINIC_OR_DEPARTMENT_OTHER)
Admission: EM | Admit: 2016-06-14 | Discharge: 2016-06-14 | Disposition: A | Discharge status: Home / Self Care | Attending: Emergency Medicine | Admitting: Emergency Medicine

## 2016-06-14 ENCOUNTER — Encounter (HOSPITAL_BASED_OUTPATIENT_CLINIC_OR_DEPARTMENT_OTHER): Payer: Self-pay | Admitting: Emergency Medicine

## 2016-06-14 ENCOUNTER — Emergency Department (HOSPITAL_BASED_OUTPATIENT_CLINIC_OR_DEPARTMENT_OTHER)

## 2016-06-14 DIAGNOSIS — Y929 Unspecified place or not applicable: Secondary | ICD-10-CM | POA: Insufficient documentation

## 2016-06-14 DIAGNOSIS — Y939 Activity, unspecified: Secondary | ICD-10-CM | POA: Insufficient documentation

## 2016-06-14 DIAGNOSIS — S0292XA Unspecified fracture of facial bones, initial encounter for closed fracture: Secondary | ICD-10-CM | POA: Diagnosis present

## 2016-06-14 DIAGNOSIS — Y999 Unspecified external cause status: Secondary | ICD-10-CM | POA: Insufficient documentation

## 2016-06-14 DIAGNOSIS — S022XXA Fracture of nasal bones, initial encounter for closed fracture: Secondary | ICD-10-CM | POA: Insufficient documentation

## 2016-06-14 DIAGNOSIS — W01119A Fall on same level from slipping, tripping and stumbling with subsequent striking against unspecified sharp object, initial encounter: Secondary | ICD-10-CM | POA: Insufficient documentation

## 2016-06-14 DIAGNOSIS — W19XXXA Unspecified fall, initial encounter: Secondary | ICD-10-CM

## 2016-06-14 MED ORDER — HYDROCODONE-ACETAMINOPHEN 5-325 MG PO TABS: 1.0000 | ORAL_TABLET | Freq: Four times a day (QID) | ORAL | 0 refills | Status: DC | PRN

## 2016-06-14 MED ORDER — OXYMETAZOLINE HCL 0.05 % NA SOLN
2.0000 | Freq: Two times a day (BID) | NASAL | Status: DC | PRN
  Administered 2016-06-14: 2 via NASAL
  Filled 2016-06-14: qty 15

## 2016-06-14 MED ORDER — HYDROCODONE-ACETAMINOPHEN 5-325 MG PO TABS
1.0000 | ORAL_TABLET | Freq: Once | ORAL | Status: AC
  Administered 2016-06-14: 1 via ORAL
  Filled 2016-06-14: qty 1

## 2016-06-14 NOTE — ED Notes (Signed)
Patient transported to CT 

## 2016-06-14 NOTE — ED Provider Notes (Signed)
[facility]   CSN: [id number] Arrival date & time: 06/14/16  0151  First Provider Contact:  None       History   Chief Complaint Chief Complaint  Patient presents with  . Fall    HPI Cynthia Horton is a 20 y.o. female who tripped and fell about 2 and half hours ago landing on her face. She is complaining of pain and swelling in her nose. Pain is moderate and worse with palpation. She also had bilateral epistaxis earlier which is resolved. She has a headache but denies other injury.  HPI  Past Medical History:  Diagnosis Date  . Dysmenorrhea in adolescent     Patient Active Problem List   Diagnosis Date Noted  . Exercise-induced asthma 10/02/2014  . Dysmenorrhea 10/02/2014  . Anemia 10/02/2014  . Bronchospasm, exercise-induced 11/24/2013    Past Surgical History:  Procedure Laterality Date  . HERNIA REPAIR    . TONSILLECTOMY      OB History    No data available       Home Medications    Prior to Admission medications   Medication Sig Start Date End Date Taking? Authorizing Provider  albuterol (PROVENTIL HFA;VENTOLIN HFA) 108 (90 BASE) MCG/ACT inhaler Inhale 2 puffs into the lungs every 4 (four) hours as needed for wheezing or shortness of breath. 06/07/15   Kristen N Ward, DO  ibuprofen (ADVIL,MOTRIN) 800 MG tablet Take one tablet every 8 hours for painful menses 12/07/14   Kendrick Rancheborah D Schoenhoff, MD  Levonorgestrel-Ethinyl Estradiol (AMETHIA,CAMRESE) 0.15-0.03 &0.01 MG tablet Take 1 tablet by mouth daily. 04/14/16   Donato SchultzYvonne R Lowne Chase, DO    Family History Family History  Problem Relation Age of Onset  . Hypertension Mother   . Breast cancer Maternal Grandmother     x's 3  . Hypertension Maternal Grandfather   . Diabetes Maternal Grandfather   . Breast cancer Paternal Grandmother   . Lung cancer Paternal Grandmother     Social History Social History  Substance Use Topics  . Smoking status: Never Smoker  . Smokeless  tobacco: Never Used  . Alcohol use No     Allergies   Naproxen   Review of Systems Review of Systems  All other systems reviewed and are negative.    Physical Exam Updated Vital Signs BP 122/93 (BP Location: Right Arm)   Pulse 72   Temp 98.2 F (36.8 C) (Oral)   Resp 18   Wt 127 lb (57.6 kg)   LMP 06/09/2016   SpO2 100%   BMI 20.50 kg/m   Physical Exam General: Well-developed, well-nourished female in no acute distress; appearance consistent with age of record HENT: normocephalic; tenderness and swelling bridge of nose; dried blood in right naris; no hemotympanum Eyes: pupils equal, round and reactive to light; extraocular muscles intact Neck: supple; mild C-spine tenderness Heart: regular rate and rhythm Lungs: clear to auscultation bilaterally Abdomen: soft; nondistended; nontender; bowel sounds present Extremities: No deformity; full range of motion; pulses normal Neurologic: Awake, alert and oriented; motor function intact in all extremities and symmetric; no facial droop Skin: Warm and dry Psychiatric: Flat affect    ED Treatments / Results  Nursing notes and vitals signs, including pulse oximetry, reviewed.  Summary of this visit's results, reviewed by myself:  Imaging Studies: Dg Nasal Bones  Result Date: 06/14/2016 CLINICAL DATA:  Tripped and fell down 4 stairs, with nasal pain. Initial encounter. EXAM: NASAL BONES - 3+ VIEW COMPARISON:  None. FINDINGS:  There is a mildly displaced fracture through both sides of the nasal bone, with slight inferior displacement. Mild overlying soft tissue swelling is noted. No additional fractures are seen. The bony orbits are grossly unremarkable. The visualized paranasal sinuses and mastoid air cells are well-aerated. IMPRESSION: Mildly displaced fracture through both sides of the nasal bone, with slight inferior displacement. Electronically Signed   By: Roanna Raider M.D.   On: 06/14/2016 03:52   Dg Cervical Spine  Complete  Result Date: 06/14/2016 CLINICAL DATA:  Tripped on flip-flop and fell down 4 stairs, with stiffness at the cervical spine. Initial encounter. EXAM: CERVICAL SPINE - COMPLETE 4+ VIEW COMPARISON:  Cervical spine radiographs performed 08/31/2013 FINDINGS: There is no evidence of fracture or subluxation. Vertebral bodies demonstrate normal height and alignment. Intervertebral disc spaces are preserved. Prevertebral soft tissues are within normal limits. The provided odontoid view demonstrates no significant abnormality. The visualized lung apices are clear. IMPRESSION: No evidence of fracture or subluxation along the cervical spine. Electronically Signed   By: Roanna Raider M.D.   On: 06/14/2016 03:54   Procedures (including critical care time)   Final Clinical Impressions(s) / ED Diagnoses   Final diagnoses:  Nasal bone fracture, closed, initial encounter  Fall, initial encounter      Paula Libra, MD 06/14/16 0865

## 2016-06-14 NOTE — ED Triage Notes (Signed)
Patient states that he tripped and fell hit his nose. This happened about an hour ago. The patient has had a bloody nose since

## 2016-06-17 ENCOUNTER — Ambulatory Visit (INDEPENDENT_AMBULATORY_CARE_PROVIDER_SITE_OTHER): Admitting: Family Medicine

## 2016-06-17 VITALS — BP 118/76 | HR 85 | Temp 98.0°F | Wt 125.2 lb

## 2016-06-17 DIAGNOSIS — S022XXA Fracture of nasal bones, initial encounter for closed fracture: Secondary | ICD-10-CM | POA: Diagnosis not present

## 2016-06-17 DIAGNOSIS — Z30011 Encounter for initial prescription of contraceptive pills: Secondary | ICD-10-CM

## 2016-06-17 MED ORDER — HYDROCODONE-ACETAMINOPHEN 5-325 MG PO TABS: 1.0000 | ORAL_TABLET | Freq: Four times a day (QID) | ORAL | 0 refills | Status: DC | PRN

## 2016-06-17 MED ORDER — LEVONORGEST-ETH ESTRAD 91-DAY 0.15-0.03 &0.01 MG PO TABS: 1.0000 | ORAL_TABLET | Freq: Every day | ORAL | 0 refills | Status: DC

## 2016-06-17 MED ORDER — LEVONORGEST-ETH ESTRAD 91-DAY 0.15-0.03 &0.01 MG PO TABS: 1.0000 | ORAL_TABLET | Freq: Every day | ORAL | 11 refills | Status: DC

## 2016-06-17 NOTE — Progress Notes (Signed)
Pre visit review using our clinic review tool, if applicable. No additional management support is needed unless otherwise documented below in the visit note. 

## 2016-06-17 NOTE — Patient Instructions (Signed)
Nasal Fracture A nasal fracture is a break or crack in the bones or cartilage of the nose. Minor breaks do not require treatment. These breaks usually heal on their own after about one month. Serious breaks may require surgery. CAUSES This injury is usually caused by a blunt injury to the nose. This type of injury often occurs from:  Contact sports.  Car accidents.  Falls.  Getting punched. SYMPTOMS Symptoms of this injury include:  Pain.  Swelling of the nose.  Bleeding from the nose.  Bruising around the nose or eyes. This may include having black eyes.  Crooked appearance of the nose. DIAGNOSIS This injury may be diagnosed with a physical exam. The health care provider will gently feel the nose for signs of broken bones. He or she will look inside the nostrils to make sure that there is not a blood-filled swelling on the dividing wall between the nostrils (septal hematoma). X-rays of the nose may not show a nasal fracture even when one is present. In some cases, X-rays or a CT scan may be done 1-5 days after the injury. Sometimes, the health care provider will want to wait until the swelling has gone down. TREATMENT Often, minor fractures that have caused no deformity do not require treatment. More serious fractures in which bones have moved out of position may require surgery, which will take place after the swelling is gone. Surgery will stabilize and align the fracture. In some cases, a health care provider may be able to reposition the bones without surgery. This may be done in the health care provider's office after medicine is given to numb the area (local anesthetic). HOME CARE INSTRUCTIONS  If directed, apply ice to the injured area:  Put ice in a plastic bag.  Place a towel between your skin and the bag.  Leave the ice on for 20 minutes, 2-3 times per day.  Take over-the-counter and prescription medicines only as told by your health care provider.  If your nose  starts to bleed, sit in an upright position while you squeeze the soft parts of your nose against the dividing wall between your nostrils (septum) for 10 minutes.  Try to avoid blowing your nose.  Return to your normal activities as told by your health care provider. Ask your health care provider what activities are safe for you.  Avoid contact sports for 3-4 weeks or as told by your health care provider.  Keep all follow-up visits as told by your health care provider. This is important. SEEK MEDICAL CARE IF:  Your pain increases or becomes severe.  You continue to have nosebleeds.  The shape of your nose does not return to normal within 5 days.  You have pus draining out of your nose. SEEK IMMEDIATE MEDICAL CARE IF:  You have bleeding from your nose that does not stop after you pinch your nostrils closed for 20 minutes and keep ice on your nose.  You have clear fluid draining out of your nose.  You notice a grape-like swelling on the septum. This swelling is a collection of blood (hematoma) that must be drained to help prevent infection.  You have difficulty moving your eyes.  You have repeated vomiting.   This information is not intended to replace advice given to you by your health care provider. Make sure you discuss any questions you have with your health care provider.   Document Released: 10/17/2000 Document Revised: 07/11/2015 Document Reviewed: 11/27/2014 Elsevier Interactive Patient Education 2016 [employer] Inc[employer]  

## 2016-06-19 ENCOUNTER — Encounter: Payer: Self-pay | Admitting: Family Medicine

## 2016-06-19 NOTE — Progress Notes (Signed)
Patient ID: Cynthia BernhardtKlaneece Chestang, female    DOB: 12/25/1995  Age: 20 y.o. MRN: [id number]    Subjective:  Subjective  HPI Cynthia Horton presents for f/u er for fractured nose.   Pt is leaving for school Friday.  Pt fell down on 8/12   Review of Systems  Constitutional: Negative for appetite change, diaphoresis, fatigue and unexpected weight change.  HENT: Positive for nosebleeds. Negative for sinus pressure and sneezing.   Eyes: Negative for pain, redness and visual disturbance.  Respiratory: Negative for cough, chest tightness, shortness of breath and wheezing.   Cardiovascular: Negative for chest pain, palpitations and leg swelling.  Endocrine: Negative for cold intolerance, heat intolerance, polydipsia, polyphagia and polyuria.  Genitourinary: Negative for difficulty urinating, dysuria and frequency.  Neurological: Negative for dizziness, light-headedness, numbness and headaches.    History Past Medical History:  Diagnosis Date  . Dysmenorrhea in adolescent     She has a past surgical history that includes Hernia repair and Tonsillectomy.   Her family history includes Breast cancer in her maternal grandmother and paternal grandmother; Diabetes in her maternal grandfather; Hypertension in her maternal grandfather and mother; Lung cancer in her paternal grandmother.She reports that she has never smoked. She has never used smokeless tobacco. She reports that she does not drink alcohol or use drugs.  Current Outpatient Prescriptions on File Prior to Visit  Medication Sig Dispense Refill  . albuterol (PROVENTIL HFA;VENTOLIN HFA) 108 (90 BASE) MCG/ACT inhaler Inhale 2 puffs into the lungs every 4 (four) hours as needed for wheezing or shortness of breath. 1 Inhaler 0  . ibuprofen (ADVIL,MOTRIN) 800 MG tablet Take one tablet every 8 hours for painful menses 30 tablet 0   No current facility-administered medications on file prior to visit.      Objective:  Objective  Physical Exam    Constitutional: She is oriented to person, place, and time. She appears well-developed and well-nourished.  HENT:  Head: Normocephalic and atraumatic.  Nose: Sinus tenderness and nasal deformity present.  Eyes: Conjunctivae and EOM are normal.  Neck: Normal range of motion. Neck supple. No JVD present. Carotid bruit is not present. No thyromegaly present.  Cardiovascular: Normal rate, regular rhythm and normal heart sounds.   No murmur heard. Pulmonary/Chest: Effort normal and breath sounds normal. No respiratory distress. She has no wheezes. She has no rales. She exhibits no tenderness.  Musculoskeletal: She exhibits no edema.  Neurological: She is alert and oriented to person, place, and time.  Psychiatric: She has a normal mood and affect.   BP 118/76 (BP Location: Left Arm, Patient Position: Sitting, Cuff Size: Small)   Pulse 85   Temp 98 F (36.7 C) (Oral)   Wt 125 lb 3.2 oz (56.8 kg)   LMP 06/09/2016   SpO2 98%   BMI 20.21 kg/m  Wt Readings from Last 3 Encounters:  06/17/16 125 lb 3.2 oz (56.8 kg) (44 %, Z= -0.14)*  06/14/16 127 lb (57.6 kg) (48 %, Z= -0.05)*  04/14/16 123 lb 6.4 oz (56 kg) (41 %, Z= -0.22)*   * Growth percentiles are based on CDC 2-20 Years data.     Lab Results  Component Value Date   WBC 9.5 12/07/2014   HGB 14.1 12/07/2014   HCT 43.0 12/07/2014   PLT 283 12/07/2014   GLUCOSE 95 10/20/2014   CHOL 97 10/02/2014   TRIG 39 10/02/2014   HDL 49 10/02/2014   LDLCALC 40 10/02/2014   ALT 13 10/02/2014   AST 25 10/02/2014  NA 137 10/20/2014   K 3.8 10/20/2014   CL 100 10/20/2014   CREATININE 0.90 10/20/2014   BUN 13 10/20/2014   CO2 24 10/20/2014   TSH CANCELED 10/02/2014    Dg Nasal Bones  Result Date: 06/14/2016 CLINICAL DATA:  Tripped and fell down 4 stairs, with nasal pain. Initial encounter. EXAM: NASAL BONES - 3+ VIEW COMPARISON:  None. FINDINGS: There is a mildly displaced fracture through both sides of the nasal bone, with slight  inferior displacement. Mild overlying soft tissue swelling is noted. No additional fractures are seen. The bony orbits are grossly unremarkable. The visualized paranasal sinuses and mastoid air cells are well-aerated. IMPRESSION: Mildly displaced fracture through both sides of the nasal bone, with slight inferior displacement. Electronically Signed   By: Roanna RaiderJeffery  Chang M.D.   On: 06/14/2016 03:52   Dg Cervical Spine Complete  Result Date: 06/14/2016 CLINICAL DATA:  Tripped on flip-flop and fell down 4 stairs, with stiffness at the cervical spine. Initial encounter. EXAM: CERVICAL SPINE - COMPLETE 4+ VIEW COMPARISON:  Cervical spine radiographs performed 08/31/2013 FINDINGS: There is no evidence of fracture or subluxation. Vertebral bodies demonstrate normal height and alignment. Intervertebral disc spaces are preserved. Prevertebral soft tissues are within normal limits. The provided odontoid view demonstrates no significant abnormality. The visualized lung apices are clear. IMPRESSION: No evidence of fracture or subluxation along the cervical spine. Electronically Signed   By: Roanna RaiderJeffery  Chang M.D.   On: 06/14/2016 03:54     Assessment & Plan:  Plan  I have changed Ms. Loiselle's HYDROcodone-acetaminophen. I am also having her maintain her ibuprofen, albuterol, and Levonorgestrel-Ethinyl Estradiol.  Meds ordered this encounter  Medications  . DISCONTD: Levonorgestrel-Ethinyl Estradiol (AMETHIA,CAMRESE) 0.15-0.03 &0.01 MG tablet    Sig: Take 1 tablet by mouth daily.    Dispense:  1 Package    Refill:  0  . DISCONTD: Levonorgestrel-Ethinyl Estradiol (AMETHIA,CAMRESE) 0.15-0.03 &0.01 MG tablet    Sig: Take 1 tablet by mouth daily.    Dispense:  1 Package    Refill:  0  . Levonorgestrel-Ethinyl Estradiol (AMETHIA,CAMRESE) 0.15-0.03 &0.01 MG tablet    Sig: Take 1 tablet by mouth daily.    Dispense:  1 Package    Refill:  11  . HYDROcodone-acetaminophen (NORCO/VICODIN) 5-325 MG tablet    Sig: Take  1 tablet by mouth every 6 (six) hours as needed for severe pain.    Dispense:  20 tablet    Refill:  0    Problem List Items Addressed This Visit    None    Visit Diagnoses    Fractured nose, closed, initial encounter    -  Primary   Relevant Medications   HYDROcodone-acetaminophen (NORCO/VICODIN) 5-325 MG tablet   Other Relevant Orders   Ambulatory referral to ENT   Encounter for initial prescription of contraceptive pills       Relevant Medications   Levonorgestrel-Ethinyl Estradiol (AMETHIA,CAMRESE) 0.15-0.03 &0.01 MG tablet      Follow-up: No Follow-up on file.  Donato SchultzYvonne R Lowne Chase, DO

## 2016-07-09 ENCOUNTER — Telehealth: Payer: Self-pay | Admitting: Family Medicine

## 2016-07-09 NOTE — Telephone Encounter (Signed)
Med was filled 06/17/16 w/ 11 refills. Please advise mother to contact the pharmacy for refill.

## 2016-07-09 NOTE — Telephone Encounter (Signed)
Caller name:Christoffel,Kathy Relation to ZO:XWRUEApt:mother  Call back number:[phone]   Reason for call:  Mother states patient left Levonorgestrel-Ethinyl Estradiol (AMETHIA,CAMRESE) 0.15-0.03 &0.01 MG tablet at school and mother states she's currently home seeking a week worth of pills. Advised mother PCP is out of the office and mother insisted the covering physicians prescribe. Mother states medication regulates menstrual and when patient is not on medication bleeding is heavy and patient is vomiting.  Please advise

## 2016-07-10 ENCOUNTER — Other Ambulatory Visit: Payer: Self-pay

## 2016-07-10 DIAGNOSIS — Z30011 Encounter for initial prescription of contraceptive pills: Secondary | ICD-10-CM

## 2016-07-10 MED ORDER — LEVONORGEST-ETH ESTRAD 91-DAY 0.15-0.03 &0.01 MG PO TABS: 1.0000 | ORAL_TABLET | Freq: Every day | ORAL | 11 refills | Status: DC

## 2016-07-10 NOTE — Telephone Encounter (Signed)
LVM advising patient of message below °

## 2016-07-10 NOTE — Telephone Encounter (Signed)
Mother called back to follow up. Transferred to [employer].

## 2016-07-10 NOTE — Telephone Encounter (Signed)
Rx has been re-faxed, the previous script was sent to print.      KP

## 2016-07-10 NOTE — Progress Notes (Signed)
Rx has been faxed to the pharmacy. It was set to print so I went ahead and refaxed it.      KP    KP

## 2016-11-06 ENCOUNTER — Ambulatory Visit: Admitting: Medical

## 2016-12-16 ENCOUNTER — Telehealth: Payer: Self-pay | Admitting: Family Medicine

## 2016-12-16 MED ORDER — OSELTAMIVIR PHOSPHATE 75 MG PO CAPS: 75.0000 mg | ORAL_CAPSULE | Freq: Two times a day (BID) | ORAL | 0 refills | Status: DC

## 2016-12-16 NOTE — Telephone Encounter (Signed)
Mom called back to follow up on request. States patient is at college and she does not want her to go to the ER alone. Plse adv.

## 2016-12-16 NOTE — Telephone Encounter (Signed)
Caller name: Minks,Kathy Relation to pt: mother Call back number:[phone] Pharmacy:  Reason for call:  Mother states patient is away at college experiencing chills, body aches flu like symptoms, requesting Rx due to patient being out of town. (mother will call back with pharmacy information) please advise

## 2016-12-16 NOTE — Telephone Encounter (Signed)
[facility] [address] [place], [place], [place] [address] [phone]

## 2016-12-16 NOTE — Telephone Encounter (Signed)
NEED TO KNOW IF SHE HAS A FEVER--- 100.4 OR HIGHER IF YES--- TAMIFLU 75 MG BID X 5 DAYS

## 2016-12-16 NOTE — Telephone Encounter (Signed)
The mom is not with her.  States the patient feels warm, chills, headache, some diarrhea, cough, sneeze and weakness. PCP did agree to send in Tamiflu--per PCP instructions though did not think was the flu and encouraged this patient to go to her School health MD.   The mom did agree/understand instructions. Called Tamiflu into the pharmacy.

## 2017-07-24 ENCOUNTER — Ambulatory Visit (INDEPENDENT_AMBULATORY_CARE_PROVIDER_SITE_OTHER): Admitting: Family Medicine

## 2017-07-24 ENCOUNTER — Encounter: Payer: Self-pay | Admitting: Family Medicine

## 2017-07-24 VITALS — BP 102/70 | HR 90 | Temp 98.1°F | Ht 66.0 in | Wt 145.2 lb

## 2017-07-24 DIAGNOSIS — N926 Irregular menstruation, unspecified: Secondary | ICD-10-CM

## 2017-07-24 DIAGNOSIS — R5383 Other fatigue: Secondary | ICD-10-CM

## 2017-07-24 DIAGNOSIS — R8290 Unspecified abnormal findings in urine: Secondary | ICD-10-CM

## 2017-07-24 DIAGNOSIS — R319 Hematuria, unspecified: Secondary | ICD-10-CM | POA: Diagnosis not present

## 2017-07-24 DIAGNOSIS — R1013 Epigastric pain: Secondary | ICD-10-CM

## 2017-07-24 LAB — CBC WITH DIFFERENTIAL/PLATELET
BASOS PCT: 0.4 % (ref 0.0–3.0)
Basophils Absolute: 0 10*3/uL (ref 0.0–0.1)
EOS ABS: 0.1 10*3/uL (ref 0.0–0.7)
EOS PCT: 1.2 % (ref 0.0–5.0)
HCT: 44.7 % (ref 36.0–46.0)
HEMOGLOBIN: 14.5 g/dL (ref 12.0–15.0)
LYMPHS ABS: 1.9 10*3/uL (ref 0.7–4.0)
Lymphocytes Relative: 22.6 % (ref 12.0–46.0)
MCHC: 32.5 g/dL (ref 30.0–36.0)
MCV: 89.3 fl (ref 78.0–100.0)
MONO ABS: 0.6 10*3/uL (ref 0.1–1.0)
Monocytes Relative: 7.3 % (ref 3.0–12.0)
NEUTROS ABS: 5.8 10*3/uL (ref 1.4–7.7)
NEUTROS PCT: 68.5 % (ref 43.0–77.0)
PLATELETS: 266 10*3/uL (ref 150.0–400.0)
RBC: 5.01 Mil/uL (ref 3.87–5.11)
RDW: 14.2 % (ref 11.5–14.6)
WBC: 8.5 10*3/uL (ref 4.5–10.5)

## 2017-07-24 LAB — POC URINALSYSI DIPSTICK (AUTOMATED)
Bilirubin, UA: NEGATIVE
Glucose, UA: NEGATIVE
KETONES UA: NEGATIVE
LEUKOCYTES UA: NEGATIVE
Nitrite, UA: NEGATIVE
PH UA: 6 (ref 5.0–8.0)
PROTEIN UA: NEGATIVE
UROBILINOGEN UA: 0.2 U/dL

## 2017-07-24 LAB — COMPREHENSIVE METABOLIC PANEL
ALT: 10 U/L (ref 0–35)
AST: 15 U/L (ref 0–37)
Albumin: 3.9 g/dL (ref 3.5–5.2)
Alkaline Phosphatase: 45 U/L (ref 39–117)
BUN: 10 mg/dL (ref 6–23)
CHLORIDE: 106 meq/L (ref 96–112)
CO2: 26 meq/L (ref 19–32)
Calcium: 9.1 mg/dL (ref 8.4–10.5)
Creatinine, Ser: 0.97 mg/dL (ref 0.40–1.20)
GFR: 93.33 mL/min (ref >60.00)
Glucose, Bld: 89 mg/dL (ref 70–99)
POTASSIUM: 4.4 meq/L (ref 3.5–5.1)
SODIUM: 137 meq/L (ref 135–145)
Total Bilirubin: 0.5 mg/dL (ref 0.2–1.2)
Total Protein: 6.8 g/dL (ref 6.0–8.3)

## 2017-07-24 LAB — POCT URINE PREGNANCY: PREG TEST UR: NEGATIVE

## 2017-07-24 LAB — H. PYLORI ANTIBODY, IGG: H PYLORI IGG: NEGATIVE

## 2017-07-24 LAB — TSH: TSH: 1.41 u[IU]/mL (ref 0.35–5.50)

## 2017-07-24 LAB — VITAMIN B12: Vitamin B-12: 403 pg/mL (ref 211–911)

## 2017-07-24 MED ORDER — RANITIDINE HCL 150 MG PO TABS: 150.0000 mg | ORAL_TABLET | Freq: Two times a day (BID) | ORAL | 5 refills | Status: DC

## 2017-07-24 NOTE — Patient Instructions (Signed)

## 2017-07-24 NOTE — Progress Notes (Signed)
Patient ID: Cynthia Horton, female    DOB: 1996/06/17  Age: 21 y.o. MRN: [id number]    Subjective:  Subjective  HPI Cynthia Horton presents for c/o stomach problems , fatigue.    She is on 3 m bcp.  Mom is with her ---she pt has a lot of break through bleeding on bcp and she is having a lot of abd cramping .   Mom is concerned about anemia.  She has a hx of low iron --  And heavy bleeding which is why she was started on bcp.    Pt does have problems with heartburn and burping  Review of Systems  Constitutional: Positive for fatigue. Negative for appetite change, diaphoresis and unexpected weight change.  Eyes: Negative for pain, redness and visual disturbance.  Respiratory: Negative for cough, chest tightness, shortness of breath and wheezing.   Cardiovascular: Negative for chest pain, palpitations and leg swelling.  Gastrointestinal: Positive for abdominal pain. Negative for blood in stool, constipation, diarrhea, nausea, rectal pain and vomiting.  Endocrine: Negative for cold intolerance, heat intolerance, polydipsia, polyphagia and polyuria.  Genitourinary: Negative for difficulty urinating, dysuria and frequency.  Neurological: Negative for dizziness, light-headedness, numbness and headaches.    History Past Medical History:  Diagnosis Date  . Dysmenorrhea in adolescent     She has a past surgical history that includes Hernia repair and Tonsillectomy.   Her family history includes Breast cancer in her maternal grandmother and paternal grandmother; Diabetes in her maternal grandfather; Hypertension in her maternal grandfather and mother; Lung cancer in her paternal grandmother.She reports that she has never smoked. She has never used smokeless tobacco. She reports that she does not drink alcohol or use drugs.  Current Outpatient Prescriptions on File Prior to Visit  Medication Sig Dispense Refill  . Levonorgestrel-Ethinyl Estradiol (AMETHIA,CAMRESE) 0.15-0.03 &0.01 MG tablet  Take 1 tablet by mouth daily. 1 Package 11  . albuterol (PROVENTIL HFA;VENTOLIN HFA) 108 (90 BASE) MCG/ACT inhaler Inhale 2 puffs into the lungs every 4 (four) hours as needed for wheezing or shortness of breath. (Patient not taking: Reported on 07/24/2017) 1 Inhaler 0  . HYDROcodone-acetaminophen (NORCO/VICODIN) 5-325 MG tablet Take 1 tablet by mouth every 6 (six) hours as needed for severe pain. (Patient not taking: Reported on 07/24/2017) 20 tablet 0  . ibuprofen (ADVIL,MOTRIN) 800 MG tablet Take one tablet every 8 hours for painful menses (Patient not taking: Reported on 07/24/2017) 30 tablet 0   No current facility-administered medications on file prior to visit.      Objective:  Objective  Physical Exam  Constitutional: She is oriented to person, place, and time. She appears well-developed and well-nourished.  HENT:  Head: Normocephalic and atraumatic.  Eyes: Conjunctivae and EOM are normal.  Neck: Normal range of motion. Neck supple. No JVD present. Carotid bruit is not present. No thyromegaly present.  Cardiovascular: Normal rate, regular rhythm and normal heart sounds.   No murmur heard. Pulmonary/Chest: Effort normal and breath sounds normal. No respiratory distress. She has no wheezes. She has no rales. She exhibits no tenderness.  Abdominal: Soft. Bowel sounds are normal. She exhibits no distension and no mass. There is no tenderness. There is no rebound and no guarding.  Musculoskeletal: She exhibits no edema.  Neurological: She is alert and oriented to person, place, and time.  Psychiatric: She has a normal mood and affect.  Nursing note and vitals reviewed.  BP 102/70 (BP Location: Right Arm, Patient Position: Sitting, Cuff Size: Normal)   Pulse 90  Temp 98.1 F (36.7 C) (Oral)   Ht  (1.676 m)   Wt 145 lb 3.2 oz (65.9 kg)   LMP 07/20/2017   SpO2 98%   BMI 23.44 kg/m  Wt Readings from Last 3 Encounters:  07/24/17 145 lb 3.2 oz (65.9 kg)  06/17/16 125 lb 3.2 oz  (56.8 kg) (44 %, Z= -0.14)*  06/14/16 127 lb (57.6 kg) (48 %, Z= -0.05)*   * Growth percentiles are based on CDC 2-20 Years data.     Lab Results  Component Value Date   WBC 8.5 07/24/2017   HGB 14.5 07/24/2017   HCT 44.7 07/24/2017   PLT 266.0 07/24/2017   GLUCOSE 89 07/24/2017   CHOL 97 10/02/2014   TRIG 39 10/02/2014   HDL 49 10/02/2014   LDLCALC 40 10/02/2014   ALT 10 07/24/2017   AST 15 07/24/2017   NA 137 07/24/2017   K 4.4 07/24/2017   CL 106 07/24/2017   CREATININE 0.97 07/24/2017   BUN 10 07/24/2017   CO2 26 07/24/2017   TSH 1.41 07/24/2017    Dg Nasal Bones  Result Date: 06/14/2016 CLINICAL DATA:  Tripped and fell down 4 stairs, with nasal pain. Initial encounter. EXAM: NASAL BONES - 3+ VIEW COMPARISON:  None. FINDINGS: There is a mildly displaced fracture through both sides of the nasal bone, with slight inferior displacement. Mild overlying soft tissue swelling is noted. No additional fractures are seen. The bony orbits are grossly unremarkable. The visualized paranasal sinuses and mastoid air cells are well-aerated. IMPRESSION: Mildly displaced fracture through both sides of the nasal bone, with slight inferior displacement. Electronically Signed   By: Roanna Raider M.D.   On: 06/14/2016 03:52   Dg Cervical Spine Complete  Result Date: 06/14/2016 CLINICAL DATA:  Tripped on flip-flop and fell down 4 stairs, with stiffness at the cervical spine. Initial encounter. EXAM: CERVICAL SPINE - COMPLETE 4+ VIEW COMPARISON:  Cervical spine radiographs performed 08/31/2013 FINDINGS: There is no evidence of fracture or subluxation. Vertebral bodies demonstrate normal height and alignment. Intervertebral disc spaces are preserved. Prevertebral soft tissues are within normal limits. The provided odontoid view demonstrates no significant abnormality. The visualized lung apices are clear. IMPRESSION: No evidence of fracture or subluxation along the cervical spine. Electronically  Signed   By: Roanna Raider M.D.   On: 06/14/2016 03:54     Assessment & Plan:  Plan  I have discontinued Cynthia Horton's oseltamivir. I am also having her start on ranitidine. Additionally, I am having her maintain her ibuprofen, albuterol, HYDROcodone-acetaminophen, and Levonorgestrel-Ethinyl Estradiol.  Meds ordered this encounter  Medications  . ranitidine (ZANTAC) 150 MG tablet    Sig: Take 1 tablet (150 mg total) by mouth 2 (two) times daily.    Dispense:  60 tablet    Refill:  5    Problem List Items Addressed This Visit    None    Visit Diagnoses    Fatigue, unspecified type    -  Primary   Relevant Orders   CBC with Differential/Platelet (Completed)   Comprehensive metabolic panel (Completed)   H. pylori antibody, IgG (Completed)   TSH (Completed)   Vitamin B12 (Completed)   Vitamin D 1,25 dihydroxy   POCT urine pregnancy (Completed)   POCT Urinalysis Dipstick (Automated) (Completed)   Dyspepsia       Relevant Medications   ranitidine (ZANTAC) 150 MG tablet   Other Relevant Orders   CBC with Differential/Platelet (Completed)   Comprehensive metabolic panel (Completed)  H. pylori antibody, IgG (Completed)   TSH (Completed)   Vitamin B12 (Completed)   Vitamin D 1,25 dihydroxy   POCT urine pregnancy (Completed)   POCT Urinalysis Dipstick (Automated) (Completed)   Irregular periods       Relevant Orders   Ambulatory referral to Obstetrics / Gynecology   Hematuria, unspecified type       Relevant Orders   Urine Culture (Completed)   Urine abnormality       Relevant Orders   Urine Culture (Completed)      Follow-up: Return if symptoms worsen or fail to improve.  Donato Schultz, DO

## 2017-07-25 LAB — URINE CULTURE
MICRO NUMBER:: 81047325
SPECIMEN QUALITY:: ADEQUATE

## 2017-07-29 LAB — VITAMIN D 1,25 DIHYDROXY
VITAMIN D 1, 25 (OH) TOTAL: 55 pg/mL (ref 18–72)
VITAMIN D3 1, 25 (OH): 55 pg/mL
Vitamin D2 1, 25 (OH)2: <8 pg/mL

## 2017-08-28 ENCOUNTER — Other Ambulatory Visit: Payer: Self-pay

## 2017-08-28 ENCOUNTER — Telehealth: Payer: Self-pay | Admitting: Family Medicine

## 2017-08-28 DIAGNOSIS — Z30011 Encounter for initial prescription of contraceptive pills: Secondary | ICD-10-CM

## 2017-08-28 MED ORDER — LEVONORGEST-ETH ESTRAD 91-DAY 0.15-0.03 &0.01 MG PO TABS: 1.0000 | ORAL_TABLET | Freq: Every day | ORAL | 11 refills | Status: DC

## 2017-08-28 NOTE — Telephone Encounter (Signed)
Caller name: Olegario MessierKathy  Relation to pt: Mother  Call back number: [phone] Pharmacy: [facility]  Reason for call: Pt's mother calling stating pt was seen recently in our office ( 07-24-2017) and that pt mentioned that she was about to be out of her birth control pills (mother did not know name of birth control pills) and thought the her rx was sent to the pharmacy, pt called the pharmacy and they stated did not have rx for pt there - that there was no more refills. Pt is completely out of birth control pills and is needing to have rx sent ASAP. Please advise.

## 2017-08-28 NOTE — Telephone Encounter (Signed)
Refill sent.

## 2017-10-05 ENCOUNTER — Ambulatory Visit (INDEPENDENT_AMBULATORY_CARE_PROVIDER_SITE_OTHER): Payer: Self-pay | Admitting: Family Medicine

## 2017-10-05 ENCOUNTER — Encounter: Payer: Self-pay | Admitting: Family Medicine

## 2017-10-05 VITALS — BP 115/73 | HR 87 | Temp 97.9°F | Resp 16 | Ht 66.0 in | Wt 143.6 lb

## 2017-10-05 DIAGNOSIS — Z30011 Encounter for initial prescription of contraceptive pills: Secondary | ICD-10-CM

## 2017-10-05 DIAGNOSIS — Z23 Encounter for immunization: Secondary | ICD-10-CM

## 2017-10-05 MED ORDER — LEVONORGEST-ETH ESTRAD 91-DAY 0.15-0.03 &0.01 MG PO TABS: 1.0000 | ORAL_TABLET | Freq: Every day | ORAL | 3 refills | Status: DC

## 2017-10-05 NOTE — Progress Notes (Signed)
Subjective:     Cynthia BernhardtKlaneece Horton is a 21 y.o. female and is here for a comprehensive physical exam. The patient reports no problems.  Social History   Socioeconomic History  . Marital status: Single    Spouse name: Not on file  . Number of children: Not on file  . Years of education: Not on file  . Highest education level: Not on file  Social Needs  . Financial resource strain: Not on file  . Food insecurity - worry: Not on file  . Food insecurity - inability: Not on file  . Transportation needs - medical: Not on file  . Transportation needs - non-medical: Not on file  Occupational History  . Occupation: Airline pilotsales    Comment: [employer]  Tobacco Use  . Smoking status: Never Smoker  . Smokeless tobacco: Never Used  Substance and Sexual Activity  . Alcohol use: No    Alcohol/week: 0.0 oz  . Drug use: No  . Sexual activity: No    Birth control/protection: Pill  Other Topics Concern  . Not on file  Social History Narrative   Lives with mother in a two story home.  No children.   Education: in college.  Exercise-No   Health Maintenance  Topic Date Due  . HIV Screening  09/03/2011  . TETANUS/TDAP  09/03/2015  . INFLUENZA VACCINE  01/31/2018 (Originally 06/03/2017)  . PAP SMEAR  05/20/2018 (Originally 09/02/2017)    The following portions of the patient's history were reviewed and updated as appropriate:  She  has a past medical history of Dysmenorrhea in adolescent. She does not have any pertinent problems on file. She  has a past surgical history that includes Hernia repair and Tonsillectomy. Her family history includes Breast cancer in her maternal grandmother and paternal grandmother; Diabetes in her maternal grandfather; Hypertension in her maternal grandfather and mother; Lung cancer in her paternal grandmother. She  reports that  has never smoked. she has never used smokeless tobacco. She reports that she does not drink alcohol or use drugs. She has a current medication  list which includes the following prescription(s): albuterol and levonorgestrel-ethinyl estradiol. Current Outpatient Medications on File Prior to Visit  Medication Sig Dispense Refill  . albuterol (PROVENTIL HFA;VENTOLIN HFA) 108 (90 BASE) MCG/ACT inhaler Inhale 2 puffs into the lungs every 4 (four) hours as needed for wheezing or shortness of breath. 1 Inhaler 0   No current facility-administered medications on file prior to visit.    She is allergic to naproxen..  Review of Systems Review of Systems  Constitutional: Negative for activity change, appetite change and fatigue.  HENT: Negative for hearing loss, congestion, tinnitus and ear discharge.  dentist [id number] Eyes: Negative for visual disturbance (see optho q1y -- vision corrected to 20/20 with glasses).  Respiratory: Negative for cough, chest tightness and shortness of breath.   Cardiovascular: Negative for chest pain, palpitations and leg swelling.  Gastrointestinal: Negative for abdominal pain, diarrhea, constipation and abdominal distention.  Genitourinary: Negative for urgency, frequency, decreased urine volume and difficulty urinating.  Musculoskeletal: Negative for back pain, arthralgias and gait problem.  Skin: Negative for color change, pallor and rash.  Neurological: Negative for dizziness, light-headedness, numbness and headaches.  Hematological: Negative for adenopathy. Does not bruise/bleed easily.  Psychiatric/Behavioral: Negative for suicidal ideas, confusion, sleep disturbance, self-injury, dysphoric mood, decreased concentration and agitation.      Objective:    BP 115/73 (BP Location: Left Arm, Cuff Size: Normal)   Pulse 87   Temp 97.9  F (36.6 C) (Oral)   Resp 16   Ht 5\' 6"  (1.676 m)   Wt 143 lb 9.6 oz (65.1 kg)   SpO2 98%   BMI 23.18 kg/m  General appearance: alert, cooperative, appears stated age and no distress Head: Normocephalic, without obvious abnormality, atraumatic Eyes: negative findings: lids  and lashes normal, conjunctivae and sclerae normal and pupils equal, round, reactive to light and accomodation Ears: normal TM's and external ear canals both ears Nose: Nares normal. Septum midline. Mucosa normal. No drainage or sinus tenderness. Throat: lips, mucosa, and tongue normal; teeth and gums normal Neck: no adenopathy, no carotid bruit, no JVD, supple, symmetrical, trachea midline and thyroid not enlarged, symmetric, no tenderness/mass/nodules Back: symmetric, no curvature. ROM normal. No CVA tenderness. Lungs: clear to auscultation bilaterally Breasts: deferred Heart: regular rate and rhythm, S1, S2 normal, no murmur, click, rub or gallop Abdomen: soft, non-tender; bowel sounds normal; no masses,  no organomegaly Pelvic: deferred Extremities: extremities normal, atraumatic, no cyanosis or edema Pulses: 2+ and symmetric Skin: Skin color, texture, turgor normal. No rashes or lesions Lymph nodes: Cervical, supraclavicular, and axillary nodes normal. Neurologic: Alert and oriented X 3, normal strength and tone. Normal symmetric reflexes. Normal coordination and gait    Assessment:    Healthy female exam.      Plan:    ghm utd Check labs See After Visit Summary for Counseling Recommendations    menveo and bexsaro given  rto 1 month for men B #2  Labs reviewed from 3 years ago

## 2017-10-05 NOTE — Patient Instructions (Signed)
Preventive Care 18-39 Years, Female Preventive care refers to lifestyle choices and visits with your health care provider that can promote health and wellness. What does preventive care include?  A yearly physical exam. This is also called an annual well check.  Dental exams once or twice a year.  Routine eye exams. Ask your health care provider how often you should have your eyes checked.  Personal lifestyle choices, including: ? Daily care of your teeth and gums. ? Regular physical activity. ? Eating a healthy diet. ? Avoiding tobacco and drug use. ? Limiting alcohol use. ? Practicing safe sex. ? Taking vitamin and mineral supplements as recommended by your health care provider. What happens during an annual well check? The services and screenings done by your health care provider during your annual well check will depend on your age, overall health, lifestyle risk factors, and family history of disease. Counseling Your health care provider may ask you questions about your:  Alcohol use.  Tobacco use.  Drug use.  Emotional well-being.  Home and relationship well-being.  Sexual activity.  Eating habits.  Work and work Statistician.  Method of birth control.  Menstrual cycle.  Pregnancy history.  Screening You may have the following tests or measurements:  Height, weight, and BMI.  Diabetes screening. This is done by checking your blood sugar (glucose) after you have not eaten for a while (fasting).  Blood pressure.  Lipid and cholesterol levels. These may be checked every 5 years starting at age 66.  Skin check.  Hepatitis C blood test.  Hepatitis B blood test.  Sexually transmitted disease (STD) testing.  BRCA-related cancer screening. This may be done if you have a family history of breast, ovarian, tubal, or peritoneal cancers.  Pelvic exam and Pap test. This may be done every 3 years starting at age 40. Starting at age 59, this may be done every 5  years if you have a Pap test in combination with an HPV test.  Discuss your test results, treatment options, and if necessary, the need for more tests with your health care provider. Vaccines Your health care provider may recommend certain vaccines, such as:  Influenza vaccine. This is recommended every year.  Tetanus, diphtheria, and acellular pertussis (Tdap, Td) vaccine. You may need a Td booster every 10 years.  Varicella vaccine. You may need this if you have not been vaccinated.  HPV vaccine. If you are 69 or younger, you may need three doses over 6 months.  Measles, mumps, and rubella (MMR) vaccine. You may need at least one dose of MMR. You may also need a second dose.  Pneumococcal 13-valent conjugate (PCV13) vaccine. You may need this if you have certain conditions and were not previously vaccinated.  Pneumococcal polysaccharide (PPSV23) vaccine. You may need one or two doses if you smoke cigarettes or if you have certain conditions.  Meningococcal vaccine. One dose is recommended if you are age 27-21 years and a first-year college student living in a residence hall, or if you have one of several medical conditions. You may also need additional booster doses.  Hepatitis A vaccine. You may need this if you have certain conditions or if you travel or work in places where you may be exposed to hepatitis A.  Hepatitis B vaccine. You may need this if you have certain conditions or if you travel or work in places where you may be exposed to hepatitis B.  Haemophilus influenzae type b (Hib) vaccine. You may need this if  you have certain risk factors.  Talk to your health care provider about which screenings and vaccines you need and how often you need them. This information is not intended to replace advice given to you by your health care provider. Make sure you discuss any questions you have with your health care provider. Document Released: 12/16/2001 Document Revised: 07/09/2016  Document Reviewed: 08/21/2015 Elsevier Interactive Patient Education  2017 [employer].

## 2018-01-28 ENCOUNTER — Encounter: Payer: Self-pay | Admitting: Family Medicine

## 2018-01-28 ENCOUNTER — Ambulatory Visit (INDEPENDENT_AMBULATORY_CARE_PROVIDER_SITE_OTHER): Payer: Self-pay | Admitting: Family Medicine

## 2018-01-28 VITALS — BP 108/78 | HR 82 | Temp 98.4°F | Resp 16 | Ht 66.0 in | Wt 144.4 lb

## 2018-01-28 DIAGNOSIS — J029 Acute pharyngitis, unspecified: Secondary | ICD-10-CM

## 2018-01-28 DIAGNOSIS — Z30011 Encounter for initial prescription of contraceptive pills: Secondary | ICD-10-CM

## 2018-01-28 DIAGNOSIS — J020 Streptococcal pharyngitis: Secondary | ICD-10-CM

## 2018-01-28 LAB — POCT RAPID STREP A (OFFICE): Rapid Strep A Screen: POSITIVE — AB

## 2018-01-28 MED ORDER — AMOXICILLIN 875 MG PO TABS: 875.0000 mg | ORAL_TABLET | Freq: Two times a day (BID) | ORAL | 0 refills | Status: DC

## 2018-01-28 MED ORDER — LEVONORGEST-ETH ESTRAD 91-DAY 0.15-0.03 &0.01 MG PO TABS: 1.0000 | ORAL_TABLET | Freq: Every day | ORAL | 3 refills | Status: DC

## 2018-01-28 MED FILL — AMOXICILLIN 875 MG TABLET: 875 | 10 days supply | Qty: 20 | Fill #0

## 2018-01-28 NOTE — Progress Notes (Signed)
Patient ID: Cynthia Horton, female   DOB: [birth year], 22 y.o.   MRN: [id number]    Subjective:  I acted as a Neurosurgeon for Dr. Zola Button.  Apolonio Schneiders, CMA   Patient ID: Cynthia Horton, female    DOB: February 01, 1996, 22 y.o.   MRN: [id number]  Chief Complaint  Patient presents with  . Sore Throat  . Ear Pain    both    HPI  Patient is in today for sore throat and ear pain in both ears. X few days   No otc meds no fever  Patient Care Team: Zola Button, Grayling Congress, DO as PCP - General (Family Medicine)   Past Medical History:  Diagnosis Date  . Dysmenorrhea in adolescent     Past Surgical History:  Procedure Laterality Date  . HERNIA REPAIR    . TONSILLECTOMY      Family History  Problem Relation Age of Onset  . Hypertension Mother   . Breast cancer Maternal Grandmother        x's 3  . Hypertension Maternal Grandfather   . Diabetes Maternal Grandfather   . Breast cancer Paternal Grandmother   . Lung cancer Paternal Grandmother     Social History   Socioeconomic History  . Marital status: Single    Spouse name: Not on file  . Number of children: Not on file  . Years of education: Not on file  . Highest education level: Not on file  Occupational History  . Occupation: Airline pilot    Comment: [employer]  Social Needs  . Financial resource strain: Not on file  . Food insecurity:    Worry: Not on file    Inability: Not on file  . Transportation needs:    Medical: Not on file    Non-medical: Not on file  Tobacco Use  . Smoking status: Never Smoker  . Smokeless tobacco: Never Used  Substance and Sexual Activity  . Alcohol use: No    Alcohol/week: 0.0 oz  . Drug use: No  . Sexual activity: Never    Birth control/protection: Pill  Lifestyle  . Physical activity:    Days per week: Not on file    Minutes per session: Not on file  . Stress: Not on file  Relationships  . Social connections:    Talks on phone: Not on file    Gets together: Not on file   Attends religious service: Not on file    Active member of club or organization: Not on file    Attends meetings of clubs or organizations: Not on file    Relationship status: Not on file  . Intimate partner violence:    Fear of current or ex partner: Not on file    Emotionally abused: Not on file    Physically abused: Not on file    Forced sexual activity: Not on file  Other Topics Concern  . Not on file  Social History Narrative   Lives with mother in a two story home.  No children.   Education: in college.  Exercise-No    Outpatient Medications Prior to Visit  Medication Sig Dispense Refill  . albuterol (PROVENTIL HFA;VENTOLIN HFA) 108 (90 BASE) MCG/ACT inhaler Inhale 2 puffs into the lungs every 4 (four) hours as needed for wheezing or shortness of breath. 1 Inhaler 0  . Levonorgestrel-Ethinyl Estradiol (AMETHIA,CAMRESE) 0.15-0.03 &0.01 MG tablet Take 1 tablet by mouth daily. 3 Package 3   No facility-administered medications prior to visit.     Allergies  Allergen Reactions  . Naproxen Other (See Comments)    Chest tightness    Review of Systems  Constitutional: Negative for fever and malaise/fatigue.  HENT: Positive for ear pain (both ears) and sore throat. Negative for congestion.   Eyes: Negative for blurred vision.  Respiratory: Negative for cough and shortness of breath.   Cardiovascular: Negative for chest pain, palpitations and leg swelling.  Gastrointestinal: Negative for vomiting.  Musculoskeletal: Negative for back pain.  Skin: Negative for rash.  Neurological: Negative for loss of consciousness and headaches.       Objective:    Physical Exam  Constitutional: She appears well-developed and well-nourished.  HENT:  Head: Normocephalic and atraumatic.  Right Ear: Hearing, tympanic membrane, external ear and ear canal normal.  Left Ear: Hearing, tympanic membrane, external ear and ear canal normal.  Nose: Rhinorrhea present. Right sinus exhibits no  maxillary sinus tenderness and no frontal sinus tenderness. Left sinus exhibits no maxillary sinus tenderness and no frontal sinus tenderness.  Mouth/Throat: Mucous membranes are not pale, not dry and not cyanotic. Posterior oropharyngeal erythema present. No oropharyngeal exudate or posterior oropharyngeal edema.  Skin: She is not diaphoretic.  Nursing note and vitals reviewed.   BP 108/78 (BP Location: Left Arm, Cuff Size: Normal)   Pulse 82   Temp 98.4 F (36.9 C) (Oral)   Resp 16   Ht 5\' 6"  (1.676 m)   Wt 144 lb 6.4 oz (65.5 kg)   SpO2 99%   BMI 23.31 kg/m  Wt Readings from Last 3 Encounters:  01/28/18 144 lb 6.4 oz (65.5 kg)  10/05/17 143 lb 9.6 oz (65.1 kg)  07/24/17 145 lb 3.2 oz (65.9 kg)   BP Readings from Last 3 Encounters:  01/28/18 108/78  10/05/17 115/73  07/24/17 102/70     Immunization History  Administered Date(s) Administered  . Meningococcal B, OMV 10/05/2017  . Meningococcal Mcv4o 10/05/2017    Health Maintenance  Topic Date Due  . HIV Screening  09/03/2011  . TETANUS/TDAP  09/03/2015  . INFLUENZA VACCINE  01/31/2018 (Originally 06/03/2017)  . PAP SMEAR  05/20/2018 (Originally 09/02/2017)    Lab Results  Component Value Date   WBC 8.5 07/24/2017   HGB 14.5 07/24/2017   HCT 44.7 07/24/2017   PLT 266.0 07/24/2017   GLUCOSE 89 07/24/2017   CHOL 97 10/02/2014   TRIG 39 10/02/2014   HDL 49 10/02/2014   LDLCALC 40 10/02/2014   ALT 10 07/24/2017   AST 15 07/24/2017   NA 137 07/24/2017   K 4.4 07/24/2017   CL 106 07/24/2017   CREATININE 0.97 07/24/2017   BUN 10 07/24/2017   CO2 26 07/24/2017   TSH 1.41 07/24/2017    Lab Results  Component Value Date   TSH 1.41 07/24/2017   Lab Results  Component Value Date   WBC 8.5 07/24/2017   HGB 14.5 07/24/2017   HCT 44.7 07/24/2017   MCV 89.3 07/24/2017   PLT 266.0 07/24/2017   Lab Results  Component Value Date   NA 137 07/24/2017   K 4.4 07/24/2017   CO2 26 07/24/2017   GLUCOSE 89  07/24/2017   BUN 10 07/24/2017   CREATININE 0.97 07/24/2017   BILITOT 0.5 07/24/2017   ALKPHOS 45 07/24/2017   AST 15 07/24/2017   ALT 10 07/24/2017   PROT 6.8 07/24/2017   ALBUMIN 3.9 07/24/2017   CALCIUM 9.1 07/24/2017   ANIONGAP 13 10/20/2014   GFR 93.33 07/24/2017   Lab Results  Component Value  Date   CHOL 97 10/02/2014   Lab Results  Component Value Date   HDL 49 10/02/2014   Lab Results  Component Value Date   LDLCALC 40 10/02/2014   Lab Results  Component Value Date   TRIG 39 10/02/2014   Lab Results  Component Value Date   CHOLHDL 2.0 10/02/2014   No results found for: HGBA1C       Assessment & Plan:   Problem List Items Addressed This Visit      Unprioritized   Strep throat - Primary    Drink plenty of fluids abx per orders Tylenol/ ib prn rto prn      Relevant Medications   amoxicillin (AMOXIL) 875 MG tablet    Other Visit Diagnoses    Sore throat       Relevant Orders   POCT rapid strep A (Completed)   Encounter for initial prescription of contraceptive pills       Relevant Medications   Levonorgestrel-Ethinyl Estradiol (AMETHIA,CAMRESE) 0.15-0.03 &0.01 MG tablet      I am having Aviyanna Wonder maintain her albuterol, Levonorgestrel-Ethinyl Estradiol, and amoxicillin.  Meds ordered this encounter  Medications  . Levonorgestrel-Ethinyl Estradiol (AMETHIA,CAMRESE) 0.15-0.03 &0.01 MG tablet    Sig: Take 1 tablet by mouth daily.    Dispense:  3 Package    Refill:  3  . DISCONTD: amoxicillin (AMOXIL) 875 MG tablet    Sig: Take 1 tablet (875 mg total) by mouth 2 (two) times daily.    Dispense:  20 tablet    Refill:  0  . amoxicillin (AMOXIL) 875 MG tablet    Sig: Take 1 tablet (875 mg total) by mouth 2 (two) times daily.    Dispense:  20 tablet    Refill:  0    CMA served as scribe during this visit. History, Physical and Plan performed by medical provider. Documentation and orders reviewed and attested to.  Donato Schultz, DO

## 2018-01-28 NOTE — Patient Instructions (Signed)
Strep Throat Strep throat is a bacterial infection of the throat. Your health care provider may call the infection tonsillitis or pharyngitis, depending on whether there is swelling in the tonsils or at the back of the throat. Strep throat is most common during the cold months of the year in children who are 5-22 years of age, but it can happen during any season in people of any age. This infection is spread from person to person (contagious) through coughing, sneezing, or close contact. What are the causes? Strep throat is caused by the bacteria called Streptococcus pyogenes. What increases the risk? This condition is more likely to develop in:  People who spend time in crowded places where the infection can spread easily.  People who have close contact with someone who has strep throat.  What are the signs or symptoms? Symptoms of this condition include:  Fever or chills.  Redness, swelling, or pain in the tonsils or throat.  Pain or difficulty when swallowing.  White or yellow spots on the tonsils or throat.  Swollen, tender glands in the neck or under the jaw.  Red rash all over the body (rare).  How is this diagnosed? This condition is diagnosed by performing a rapid strep test or by taking a swab of your throat (throat culture test). Results from a rapid strep test are usually ready in a few minutes, but throat culture test results are available after one or two days. How is this treated? This condition is treated with antibiotic medicine. Follow these instructions at home: Medicines  Take over-the-counter and prescription medicines only as told by your health care provider.  Take your antibiotic as told by your health care provider. Do not stop taking the antibiotic even if you start to feel better.  Have family members who also have a sore throat or fever tested for strep throat. They may need antibiotics if they have the strep infection. Eating and drinking  Do not  share food, drinking cups, or personal items that could cause the infection to spread to other people.  If swallowing is difficult, try eating soft foods until your sore throat feels better.  Drink enough fluid to keep your urine clear or pale yellow. General instructions  Gargle with a salt-water mixture 3-4 times per day or as needed. To make a salt-water mixture, completely dissolve -1 tsp of salt in 1 cup of warm water.  Make sure that all household members wash their hands well.  Get plenty of rest.  Stay home from school or work until you have been taking antibiotics for 24 hours.  Keep all follow-up visits as told by your health care provider. This is important. Contact a health care provider if:  The glands in your neck continue to get bigger.  You develop a rash, cough, or earache.  You cough up a thick liquid that is green, yellow-brown, or bloody.  You have pain or discomfort that does not get better with medicine.  Your problems seem to be getting worse rather than better.  You have a fever. Get help right away if:  You have new symptoms, such as vomiting, severe headache, stiff or painful neck, chest pain, or shortness of breath.  You have severe throat pain, drooling, or changes in your voice.  You have swelling of the neck, or the skin on the neck becomes red and tender.  You have signs of dehydration, such as fatigue, dry mouth, and decreased urination.  You become increasingly sleepy, or   you cannot wake up completely.  Your joints become red or painful. This information is not intended to replace advice given to you by your health care provider. Make sure you discuss any questions you have with your health care provider. Document Released: 10/17/2000 Document Revised: 06/18/2016 Document Reviewed: 02/12/2015 Elsevier Interactive Patient Education  2018 [employer]  

## 2018-01-29 DIAGNOSIS — J020 Streptococcal pharyngitis: Secondary | ICD-10-CM | POA: Insufficient documentation

## 2018-01-29 NOTE — Assessment & Plan Note (Signed)
Drink plenty of fluids abx per orders Tylenol/ ib prn rto prn

## 2018-06-20 NOTE — Progress Notes (Deleted)
[facility] [place] [address], [address] [place], [place] [address] [phone] [phone]  Date:  06/21/2018   Name:  Cynthia BernhardtKlaneece Horton   DOB:  11/02/1996   MRN:  [id number]  PCP:  Donato SchultzLowne Chase, Yvonne R, DO    Chief Complaint: No chief complaint on file.   History of Present Illness:  Cynthia Horton is a 22 y.o. very pleasant female patient who presents with the following:  Pt of Dr. Laury AxonLowne here today with concern about her menstrual cycle.  I have not seen her in the past. History of EIA   Patient Active Problem List   Diagnosis Date Noted  . Strep throat 01/29/2018  . Exercise-induced asthma 10/02/2014  . Dysmenorrhea 10/02/2014  . Anemia 10/02/2014  . Bronchospasm, exercise-induced 11/24/2013    Past Medical History:  Diagnosis Date  . Dysmenorrhea in adolescent     Past Surgical History:  Procedure Laterality Date  . HERNIA REPAIR    . TONSILLECTOMY      Social History   Tobacco Use  . Smoking status: Never Smoker  . Smokeless tobacco: Never Used  Substance Use Topics  . Alcohol use: No    Alcohol/week: 0.0 standard drinks  . Drug use: No    Family History  Problem Relation Age of Onset  . Hypertension Mother   . Breast cancer Maternal Grandmother        x's 3  . Hypertension Maternal Grandfather   . Diabetes Maternal Grandfather   . Breast cancer Paternal Grandmother   . Lung cancer Paternal Grandmother     Allergies  Allergen Reactions  . Naproxen Other (See Comments)    Chest tightness    Medication list has been reviewed and updated.  Current Outpatient Medications on File Prior to Visit  Medication Sig Dispense Refill  . albuterol (PROVENTIL HFA;VENTOLIN HFA) 108 (90 BASE) MCG/ACT inhaler Inhale 2 puffs into the lungs every 4 (four) hours as needed for wheezing or shortness of breath. 1 Inhaler 0  . amoxicillin (AMOXIL) 875 MG tablet Take 1 tablet (875 mg total) by mouth 2 (two) times daily. 20  tablet 0  . Levonorgestrel-Ethinyl Estradiol (AMETHIA,CAMRESE) 0.15-0.03 &0.01 MG tablet Take 1 tablet by mouth daily. 3 Package 3   No current facility-administered medications on file prior to visit.     Review of Systems:  As per HPI- otherwise negative.   Physical Examination: There were no vitals filed for this visit. There were no vitals filed for this visit. There is no height or weight on file to calculate BMI. Ideal Body Weight:    GEN: WDWN, NAD, Non-toxic, A & O x 3 HEENT: Atraumatic, Normocephalic. Neck supple. No masses, No LAD. Ears and Nose: No external deformity. CV: RRR, No M/G/R. No JVD. No thrill. No extra heart sounds. PULM: CTA B, no wheezes, crackles, rhonchi. No retractions. No resp. distress. No accessory muscle use. ABD: S, NT, ND, +BS. No rebound. No HSM. EXTR: No c/c/e NEURO Normal gait.  PSYCH: Normally interactive. Conversant. Not depressed or anxious appearing.  Calm demeanor.    Assessment and Plan: ***  Signed Abbe AmsterdamJessica Kanija Remmel, MD

## 2018-06-21 ENCOUNTER — Ambulatory Visit: Payer: Self-pay | Admitting: Family Medicine

## 2018-06-21 DIAGNOSIS — Z0289 Encounter for other administrative examinations: Secondary | ICD-10-CM

## 2018-07-06 ENCOUNTER — Encounter: Payer: Self-pay | Admitting: Family Medicine

## 2018-07-11 ENCOUNTER — Encounter (HOSPITAL_BASED_OUTPATIENT_CLINIC_OR_DEPARTMENT_OTHER): Payer: Self-pay | Admitting: Emergency Medicine

## 2018-07-11 ENCOUNTER — Other Ambulatory Visit: Payer: Self-pay

## 2018-07-11 ENCOUNTER — Emergency Department (HOSPITAL_BASED_OUTPATIENT_CLINIC_OR_DEPARTMENT_OTHER)
Admission: EM | Admit: 2018-07-11 | Discharge: 2018-07-12 | Disposition: A | Discharge status: Home / Self Care | Payer: Self-pay | Attending: Emergency Medicine | Admitting: Emergency Medicine

## 2018-07-11 ENCOUNTER — Emergency Department (HOSPITAL_BASED_OUTPATIENT_CLINIC_OR_DEPARTMENT_OTHER): Payer: Self-pay

## 2018-07-11 DIAGNOSIS — R1084 Generalized abdominal pain: Secondary | ICD-10-CM | POA: Insufficient documentation

## 2018-07-11 DIAGNOSIS — N83201 Unspecified ovarian cyst, right side: Secondary | ICD-10-CM

## 2018-07-11 DIAGNOSIS — N83291 Other ovarian cyst, right side: Secondary | ICD-10-CM | POA: Insufficient documentation

## 2018-07-11 LAB — CBC WITH DIFFERENTIAL/PLATELET
Basophils Absolute: 0 10*3/uL (ref 0.0–0.1)
Basophils Relative: 0 %
Eosinophils Absolute: 0 10*3/uL (ref 0.0–0.7)
Eosinophils Relative: 0 %
HEMATOCRIT: 33 % — AB (ref 36.0–46.0)
Hemoglobin: 10.8 g/dL — ABNORMAL LOW (ref 12.0–15.0)
LYMPHS PCT: 16 %
Lymphs Abs: 2.3 10*3/uL (ref 0.7–4.0)
MCH: 23.9 pg — ABNORMAL LOW (ref 26.0–34.0)
MCHC: 32.7 g/dL (ref 30.0–36.0)
MCV: 73.2 fL — AB (ref 78.0–100.0)
MONO ABS: 1.5 10*3/uL — AB (ref 0.1–1.0)
Monocytes Relative: 10 %
NEUTROS ABS: 10.4 10*3/uL — AB (ref 1.7–7.7)
Neutrophils Relative %: 74 %
Platelets: 366 10*3/uL (ref 150–400)
RBC: 4.51 MIL/uL (ref 3.87–5.11)
RDW: 16.4 % — AB (ref 11.5–15.5)
WBC: 14.1 10*3/uL — ABNORMAL HIGH (ref 4.0–10.5)

## 2018-07-11 LAB — COMPREHENSIVE METABOLIC PANEL
ALT: 11 U/L (ref 0–44)
AST: 17 U/L (ref 15–41)
Albumin: 3.6 g/dL (ref 3.5–5.0)
Alkaline Phosphatase: 57 U/L (ref 38–126)
Anion gap: 9 (ref 5–15)
BILIRUBIN TOTAL: 0.3 mg/dL (ref 0.3–1.2)
BUN: 10 mg/dL (ref 6–20)
CALCIUM: 8.4 mg/dL — AB (ref 8.9–10.3)
CO2: 23 mmol/L (ref 22–32)
CREATININE: 0.83 mg/dL (ref 0.44–1.00)
Chloride: 105 mmol/L (ref 98–111)
GFR calc Af Amer: >60 mL/min (ref >60)
Glucose, Bld: 90 mg/dL (ref 70–99)
Potassium: 4 mmol/L (ref 3.5–5.1)
Sodium: 137 mmol/L (ref 135–145)
TOTAL PROTEIN: 7 g/dL (ref 6.5–8.1)

## 2018-07-11 LAB — URINALYSIS, ROUTINE W REFLEX MICROSCOPIC
BILIRUBIN URINE: NEGATIVE
Glucose, UA: NEGATIVE mg/dL
KETONES UR: NEGATIVE mg/dL
Nitrite: NEGATIVE
PROTEIN: NEGATIVE mg/dL
SPECIFIC GRAVITY, URINE: 1.01 (ref 1.005–1.030)
pH: 8 (ref 5.0–8.0)

## 2018-07-11 LAB — LIPASE, BLOOD: Lipase: 27 U/L (ref 11–51)

## 2018-07-11 LAB — URINALYSIS, MICROSCOPIC (REFLEX)

## 2018-07-11 LAB — PREGNANCY, URINE: PREG TEST UR: NEGATIVE

## 2018-07-11 MED ORDER — DICYCLOMINE HCL 10 MG PO CAPS
10.0000 mg | ORAL_CAPSULE | Freq: Once | ORAL | Status: AC
  Administered 2018-07-11: 10 mg via ORAL
  Filled 2018-07-11: qty 1

## 2018-07-11 MED ORDER — ONDANSETRON 4 MG PO TBDP
4.0000 mg | ORAL_TABLET | Freq: Once | ORAL | Status: AC
  Administered 2018-07-11: 4 mg via ORAL
  Filled 2018-07-11: qty 1

## 2018-07-11 NOTE — ED Provider Notes (Signed)
[facility] EMERGENCY DEPARTMENT Provider Note   CSN: [id number] Arrival date & time: 07/11/18  1954     History   Chief Complaint Chief Complaint  Patient presents with  . Abdominal Pain    HPI Cynthia Horton is a 22 y.o. female.  HPI  This is a 22 year old female who presents with abdominal pain.  Reports onset of diffuse abdominal pain on Thursday.  Generally worsening.  Reports that it is sharp and nonradiating.  Pain currently is 10 out of 10.  She took Tylenol with minimal relief.  She reports one episode of vomiting and has had some diarrhea.  No bloody stools.  Denies fevers.  Reports pain is worse with coughing, walking, urinating.  Denies any back pain.  Does not think she could be pregnant.  Denies any vaginal discharge or concerns for STDs.  Past Medical History:  Diagnosis Date  . Dysmenorrhea in adolescent     Patient Active Problem List   Diagnosis Date Noted  . Strep throat 01/29/2018  . Exercise-induced asthma 10/02/2014  . Dysmenorrhea 10/02/2014  . Anemia 10/02/2014  . Bronchospasm, exercise-induced 11/24/2013    Past Surgical History:  Procedure Laterality Date  . HERNIA REPAIR    . TONSILLECTOMY       OB History   None      Home Medications    Prior to Admission medications   Medication Sig Start Date End Date Taking? Authorizing Provider  albuterol (PROVENTIL HFA;VENTOLIN HFA) 108 (90 BASE) MCG/ACT inhaler Inhale 2 puffs into the lungs every 4 (four) hours as needed for wheezing or shortness of breath. 06/07/15   Ward, Layla Maw, DO  amoxicillin (AMOXIL) 875 MG tablet Take 1 tablet (875 mg total) by mouth 2 (two) times daily. 01/28/18   Donato Schultz, DO  HYDROcodone-acetaminophen (NORCO/VICODIN) 5-325 MG tablet Take 1 tablet by mouth every 6 (six) hours as needed. 07/12/18   Allysha Tryon, Mayer Masker, MD  Levonorgestrel-Ethinyl Estradiol (AMETHIA,CAMRESE) 0.15-0.03 &0.01 MG tablet Take 1 tablet by mouth daily. 01/28/18   Donato Schultz, DO    Family History Family History  Problem Relation Age of Onset  . Hypertension Mother   . Breast cancer Maternal Grandmother        x's 3  . Hypertension Maternal Grandfather   . Diabetes Maternal Grandfather   . Breast cancer Paternal Grandmother   . Lung cancer Paternal Grandmother     Social History Social History   Tobacco Use  . Smoking status: Never Smoker  . Smokeless tobacco: Never Used  Substance Use Topics  . Alcohol use: No    Alcohol/week: 0.0 standard drinks  . Drug use: No     Allergies   Naproxen   Review of Systems Review of Systems  Constitutional: Negative for fever.  Respiratory: Negative for shortness of breath.   Cardiovascular: Negative for chest pain.  Gastrointestinal: Positive for abdominal pain, diarrhea, nausea and vomiting.  Genitourinary: Negative for dysuria and vaginal discharge.  All other systems reviewed and are negative.    Physical Exam Updated Vital Signs BP 116/82   Pulse 88   Temp 99.1 F (37.3 C) (Oral)   Resp 18   Ht 1.702 m (5\' 7" )   Wt 61.2 kg   LMP 07/05/2018   SpO2 100%   BMI 21.14 kg/m   Physical Exam  Constitutional: She is oriented to person, place, and time. She appears well-developed and well-nourished. No distress.  HENT:  Head: Normocephalic and atraumatic.  Neck: Neck supple.  Cardiovascular: Normal rate, regular rhythm and normal heart sounds.  Pulmonary/Chest: Effort normal. No respiratory distress. She has no wheezes.  Abdominal: Soft. Normal appearance. Bowel sounds are increased. There is generalized tenderness. There is no rebound and no guarding.  Neurological: She is alert and oriented to person, place, and time.  Skin: Skin is warm and dry.  Psychiatric: She has a normal mood and affect.  Nursing note and vitals reviewed.    ED Treatments / Results  Labs (all labs ordered are listed, but only abnormal results are displayed) Labs Reviewed  URINALYSIS, ROUTINE W  REFLEX MICROSCOPIC - Abnormal; Notable for the following components:      Result Value   Hgb urine dipstick MODERATE (*)    Leukocytes, UA MODERATE (*)    All other components within normal limits  URINALYSIS, MICROSCOPIC (REFLEX) - Abnormal; Notable for the following components:   Bacteria, UA MANY (*)    All other components within normal limits  CBC WITH DIFFERENTIAL/PLATELET - Abnormal; Notable for the following components:   WBC 14.1 (*)    Hemoglobin 10.8 (*)    HCT 33.0 (*)    MCV 73.2 (*)    MCH 23.9 (*)    RDW 16.4 (*)    Neutro Abs 10.4 (*)    Monocytes Absolute 1.5 (*)    All other components within normal limits  COMPREHENSIVE METABOLIC PANEL - Abnormal; Notable for the following components:   Calcium 8.4 (*)    All other components within normal limits  WET PREP, GENITAL  PREGNANCY, URINE  LIPASE, BLOOD  GC/CHLAMYDIA PROBE AMP ([place]) NOT AT [facility]    EKG None  Radiology Dg Abdomen 1 View  Result Date: 07/11/2018 CLINICAL DATA:  Abdominal pain for 3 days. Intermittent vomiting and diarrhea. EXAM: ABDOMEN - 1 VIEW COMPARISON:  08/01/2017 FINDINGS: Nondistended gas-filled loops of small bowel are noted. Stool and gas within the colon identified. No dilated bowel loops are present. No suspicious calcifications are present. IMPRESSION: Nonspecific nonobstructive bowel gas pattern. Electronically Signed   By: Harmon Pier M.D.   On: 07/11/2018 23:59   Ct Abdomen Pelvis W Contrast  Result Date: 07/12/2018 CLINICAL DATA:  Generalized abdominal pain, sharp pain worsened with coughing, laughing and walking, negative pregnancy test EXAM: CT ABDOMEN AND PELVIS WITH CONTRAST TECHNIQUE: Multidetector CT imaging of the abdomen and pelvis was performed using the standard protocol following bolus administration of intravenous contrast. Sagittal and coronal MPR images reconstructed from axial data set. CONTRAST:  ISOVUE-300 IOPAMIDOL (ISOVUE-300) INJECTION 61% IV. No oral  contrast. COMPARISON:  None FINDINGS: Lower chest: Lung bases clear Hepatobiliary: Gallbladder and liver normal appearance Pancreas: Normal appearance Spleen: Normal appearance Adrenals/Urinary Tract: Adrenal glands, kidneys, ureters, and bladder normal appearance Stomach/Bowel: Normal appendix in RIGHT pelvis. Stomach and bowel loops normal appearance. Vascular/Lymphatic: Vascular structures patent.  No adenopathy. Reproductive: Unremarkable uterus and LEFT adnexa. Amorphous RIGHT adnexal tissue. No definite discrete RIGHT ovarian mass is visualized but intermediate attenuation fluid is seen in the cul-de-sac question blood, raising question of a ruptured RIGHT ovarian cyst. Other: No additional free fluid.  No free air.  No hernia. Musculoskeletal: Unremarkable IMPRESSION: Intermediate attenuation fluid in cul-de-sac question blood. Amorphous RIGHT adnexa, cannot exclude ruptured RIGHT ovarian cyst; recommend pelvic sonography to assess. Remainder of exam unremarkable. Electronically Signed   By: Ulyses Southward M.D.   On: 07/12/2018 02:07    Procedures Procedures (including critical care time)  Medications Ordered in ED Medications  dicyclomine (BENTYL) capsule 10 mg (10 mg Oral Given 07/11/18 2327)  ondansetron (ZOFRAN-ODT) disintegrating tablet 4 mg (4 mg Oral Given 07/11/18 2327)  morphine 4 MG/ML injection 4 mg (4 mg Intravenous Given 07/12/18 0126)  ondansetron (ZOFRAN) injection 4 mg (4 mg Intravenous Given 07/12/18 0126)  iopamidol (ISOVUE-300) 61 % injection 100 mL (100 mLs Intravenous Contrast Given 07/12/18 0140)  HYDROcodone-acetaminophen (NORCO/VICODIN) 5-325 MG per tablet 1 tablet (1 tablet Oral Given 07/12/18 0308)  ketorolac (TORADOL) 30 MG/ML injection 30 mg (30 mg Intravenous Given 07/12/18 0308)     Initial Impression / Assessment and Plan / ED Course  I have reviewed the triage vital signs and the nursing notes.  Pertinent labs & imaging results that were available during my care of the  patient were reviewed by me and considered in my medical decision making (see chart for details).  Clinical Course as of Jul 13 315  Mon Jul 12, 2018  9147 Patient did not tolerate pelvic exam.  She reports that she has never been sexually active.  She does not use tampons.  She is much improved after pain medication.  She has no significant right lower quadrant tenderness.   [CH]    Clinical Course User Index [CH] Lorrin Bodner, Mayer Masker, MD    Presents with abdominal pain since Thursday.  She is overall nontoxic-appearing.  Is diffusely tender on exam without signs of peritonitis.  Patient was initially given Bentyl and Zofran.  Lab work obtained.  She does have a leukocytosis but otherwise lab work-up is unremarkable.  On recheck, she continues to endorse pain.  No improvement with pain medication.  IV was placed and patient was given morphine and Zofran.  Decision to obtain CT to rule out appendicitis or other intra-abdominal pathology.  Patient reports that she is not sexually active.  CT scan does not show any evidence of appendicitis but does show some pelvic free fluid and possible right adnexal cyst, ruptured.  Patient has never had a pelvic exam.  She continues to deny sexual activity.  She does not even use tampons.  I attempted pelvic exam; however, patient did not tolerate it.  She is much improved after pain medication.  Hemoglobin is 10.8.  She is hemodynamically stable.  He needs a follow-up pelvic ultrasound.  Hope that adnexa can be visualized transabdominally as I do not feel like the patient will tolerate transvaginal ultrasound.  Given that she is feeling better, feel she is safe for discharge for return later today for ultrasound.  She and her mother were updated and are agreeable to plan.  After history, exam, and medical workup I feel the patient has been appropriately medically screened and is safe for discharge home. Pertinent diagnoses were discussed with the patient. Patient was  given return precautions.   Final Clinical Impressions(s) / ED Diagnoses   Final diagnoses:  Generalized abdominal pain  Cyst of right ovary    ED Discharge Orders         Ordered    US PELVIS (TRANSABDOMINAL ONLY)     07/12/18 0256    HYDROcodone-acetaminophen (NORCO/VICODIN) 5-325 MG tablet  Every 6 hours PRN     07/12/18 0313           Shon Baton, MD 07/12/18 [phone]

## 2018-07-11 NOTE — ED Triage Notes (Signed)
Pt c/o general abd pain; sts pain is sharp and is worse when she coughs, laughs, walks, etc. Denies NV, reports one episode of diarrhea this a.m.

## 2018-07-12 ENCOUNTER — Ambulatory Visit (HOSPITAL_BASED_OUTPATIENT_CLINIC_OR_DEPARTMENT_OTHER): Payer: Self-pay

## 2018-07-12 ENCOUNTER — Ambulatory Visit (HOSPITAL_BASED_OUTPATIENT_CLINIC_OR_DEPARTMENT_OTHER)
Admission: RE | Admit: 2018-07-12 | Discharge: 2018-07-12 | Disposition: A | Discharge status: Home / Self Care | Payer: Self-pay | Source: Ambulatory Visit | Attending: Emergency Medicine | Admitting: Emergency Medicine

## 2018-07-12 ENCOUNTER — Emergency Department (HOSPITAL_BASED_OUTPATIENT_CLINIC_OR_DEPARTMENT_OTHER): Payer: Self-pay

## 2018-07-12 ENCOUNTER — Encounter (HOSPITAL_BASED_OUTPATIENT_CLINIC_OR_DEPARTMENT_OTHER): Payer: Self-pay

## 2018-07-12 DIAGNOSIS — R9389 Abnormal findings on diagnostic imaging of other specified body structures: Secondary | ICD-10-CM | POA: Insufficient documentation

## 2018-07-12 MED ORDER — ONDANSETRON HCL 4 MG/2ML IJ SOLN
4.0000 mg | Freq: Once | INTRAMUSCULAR | Status: AC
  Administered 2018-07-12: 4 mg via INTRAVENOUS
  Filled 2018-07-12: qty 2

## 2018-07-12 MED ORDER — IOPAMIDOL (ISOVUE-300) INJECTION 61%
100.0000 mL | Freq: Once | INTRAVENOUS | Status: AC | PRN
  Administered 2018-07-12: 100 mL via INTRAVENOUS

## 2018-07-12 MED ORDER — KETOROLAC TROMETHAMINE 30 MG/ML IJ SOLN
30.0000 mg | Freq: Once | INTRAMUSCULAR | Status: DC
  Filled 2018-07-12: qty 1

## 2018-07-12 MED ORDER — KETOROLAC TROMETHAMINE 30 MG/ML IJ SOLN
30.0000 mg | Freq: Once | INTRAMUSCULAR | Status: AC
  Administered 2018-07-12: 30 mg via INTRAVENOUS

## 2018-07-12 MED ORDER — MORPHINE SULFATE (PF) 4 MG/ML IV SOLN
4.0000 mg | Freq: Once | INTRAVENOUS | Status: AC
  Administered 2018-07-12: 4 mg via INTRAVENOUS
  Filled 2018-07-12: qty 1

## 2018-07-12 MED ORDER — HYDROCODONE-ACETAMINOPHEN 5-325 MG PO TABS
1.0000 | ORAL_TABLET | Freq: Once | ORAL | Status: AC
  Administered 2018-07-12: 1 via ORAL
  Filled 2018-07-12: qty 1

## 2018-07-12 MED ORDER — HYDROCODONE-ACETAMINOPHEN 5-325 MG PO TABS: 1.0000 | ORAL_TABLET | Freq: Four times a day (QID) | ORAL | 0 refills | Status: DC | PRN

## 2018-07-12 MED FILL — HYDROCODON-APAP 5-325: 5-325 | 2 days supply | Qty: 10 | Fill #0

## 2018-07-12 NOTE — Discharge Instructions (Addendum)
You were seen today for abdominal pain.  Your CT scan shows some changes are over your right ovary which could be a ruptured ovarian cyst.  You need to have a pelvic ultrasound to better characterize.  Take pain medications as prescribed.  If you develop worsening pain, dizziness, any new or worsening symptoms you should be reevaluated immediately.

## 2018-07-12 NOTE — ED Notes (Signed)
Patient transported to CT 

## 2018-07-12 NOTE — ED Notes (Signed)
MD at bedside for pelvic

## 2018-07-12 NOTE — ED Notes (Signed)
Fluid challenge given. Pt denies nausea.

## 2018-07-13 ENCOUNTER — Ambulatory Visit (INDEPENDENT_AMBULATORY_CARE_PROVIDER_SITE_OTHER): Payer: Self-pay | Admitting: Family Medicine

## 2018-07-13 ENCOUNTER — Encounter: Payer: Self-pay | Admitting: Family Medicine

## 2018-07-13 VITALS — BP 119/78 | HR 98 | Temp 98.9°F | Resp 16 | Ht 66.0 in | Wt 144.0 lb

## 2018-07-13 DIAGNOSIS — R300 Dysuria: Secondary | ICD-10-CM

## 2018-07-13 DIAGNOSIS — R109 Unspecified abdominal pain: Secondary | ICD-10-CM

## 2018-07-13 LAB — POC URINALSYSI DIPSTICK (AUTOMATED)
Bilirubin, UA: NEGATIVE
GLUCOSE UA: NEGATIVE
KETONES UA: NEGATIVE
Leukocytes, UA: NEGATIVE
Nitrite, UA: NEGATIVE
Protein, UA: NEGATIVE
Urobilinogen, UA: 0.2 E.U./dL
pH, UA: 6 (ref 5.0–8.0)

## 2018-07-13 MED ORDER — SULFAMETHOXAZOLE-TRIMETHOPRIM 400-80 MG PO TABS: 1.0000 | ORAL_TABLET | Freq: Two times a day (BID) | ORAL | 0 refills | Status: DC

## 2018-07-13 MED FILL — SULFAMETHOXAZOLE-TMP SS TAB: 400-80 | 7 days supply | Qty: 14 | Fill #0

## 2018-07-13 NOTE — Patient Instructions (Signed)
Flank Pain Flank pain is pain in your side. The flank is the area of your side between your upper belly (abdomen) and your back. The pain may occur over a short period of time (acute) or may be long-term or come back often (chronic). It may be mild or very bad. Pain in this area can be caused by many different things. Follow these instructions at home:  Rest as told by your doctor.  Drink enough fluid to keep your pee (urine) clear or pale yellow.  Take over-the-counter and prescription medicines only as told by your doctor.  Keep all follow-up visits as told by your doctor. This is important. Contact a doctor if:  Medicine does not help your pain.  You have new symptoms.  Your pain gets worse.  You have a fever.  Your symptoms last longer than 2-3 days. Get help right away if:  Your tummy hurts or is swollen.  You are short of breath.  You feel sick to your stomach (nauseous) and it does not go away.  You cannot stop throwing up (vomiting).  You feel like you will pass out or you do pass out (faint).  You have blood in your pee.  You have a fever and your symptoms suddenly get worse. This information is not intended to replace advice given to you by your health care provider. Make sure you discuss any questions you have with your health care provider. Document Released: 07/29/2008 Document Revised: 07/11/2016 Document Reviewed: 07/24/2015 Elsevier Interactive Patient Education  2018 Elsevier Inc.  

## 2018-07-13 NOTE — Progress Notes (Signed)
Patient ID: Cynthia Horton, female    DOB: August 10, 1996  Age: 22 y.o. MRN: [id number]    Subjective:  Subjective  HPI [name] presents for er f/u for flank pain, dysuria, abd pain.   CT/ [place] neg.     Wbc was high but it was not addressed per pt.  Pain has improved but she still has pain in flank b/l,  No   Review of Systems  Constitutional: Negative for chills and fever.  HENT: Negative for congestion and hearing loss.   Eyes: Negative for discharge.  Respiratory: Negative for cough and shortness of breath.   Cardiovascular: Negative for chest pain, palpitations and leg swelling.  Gastrointestinal: Negative for abdominal pain, blood in stool, constipation, diarrhea, nausea and vomiting.  Genitourinary: Positive for flank pain. Negative for dysuria, frequency, hematuria and urgency.  Musculoskeletal: Negative for back pain and myalgias.  Skin: Negative for rash.  Allergic/Immunologic: Negative for environmental allergies.  Neurological: Negative for dizziness, weakness and headaches.  Hematological: Does not bruise/bleed easily.  Psychiatric/Behavioral: Negative for suicidal ideas. The patient is not nervous/anxious.     History Past Medical History:  Diagnosis Date  . Dysmenorrhea in adolescent     She has a past surgical history that includes Hernia repair and Tonsillectomy.   Her family history includes Breast cancer in her maternal grandmother and paternal grandmother; Diabetes in her maternal grandfather; Hypertension in her maternal grandfather and mother; Lung cancer in her paternal grandmother.She reports that she has never smoked. She has never used smokeless tobacco. She reports that she drinks alcohol. She reports that she does not use drugs.  Current Outpatient Medications on File Prior to Visit  Medication Sig Dispense Refill  . HYDROcodone-acetaminophen (NORCO/VICODIN) 5-325 MG tablet Take 1 tablet by mouth every 6 (six) hours as needed. 10 tablet 0   No  current facility-administered medications on file prior to visit.      Objective:  Objective  Physical Exam  Constitutional: She is oriented to person, place, and time. She appears well-developed and well-nourished.  HENT:  Head: Normocephalic and atraumatic.  Eyes: Conjunctivae and EOM are normal.  Neck: Normal range of motion. Neck supple. No JVD present. Carotid bruit is not present. No thyromegaly present.  Cardiovascular: Normal rate, regular rhythm and normal heart sounds.  No murmur heard. Pulmonary/Chest: Effort normal and breath sounds normal. No respiratory distress. She has no wheezes. She has no rales. She exhibits no tenderness.  Musculoskeletal: She exhibits tenderness. She exhibits no edema.  Mild tenderness b/l flank  Neurological: She is alert and oriented to person, place, and time.  Psychiatric: She has a normal mood and affect.  Nursing note and vitals reviewed.  BP 119/78 (BP Location: Right Arm, Cuff Size: Normal)   Pulse 98   Temp 98.9 F (37.2 C) (Oral)   Resp 16   Ht 5\' 6"  (1.676 m)   Wt 144 lb (65.3 kg)   LMP 07/05/2018   SpO2 99%   BMI 23.24 kg/m  Wt Readings from Last 3 Encounters:  07/13/18 144 lb (65.3 kg)  07/11/18 135 lb (61.2 kg)  01/28/18 144 lb 6.4 oz (65.5 kg)     Lab Results  Component Value Date   WBC 14.1 (H) 07/11/2018   HGB 10.8 (L) 07/11/2018   HCT 33.0 (L) 07/11/2018   PLT 366 07/11/2018   GLUCOSE 90 07/11/2018   CHOL 97 10/02/2014   TRIG 39 10/02/2014   HDL 49 10/02/2014   LDLCALC 40 10/02/2014  ALT 11 07/11/2018   AST 17 07/11/2018   NA 137 07/11/2018   K 4.0 07/11/2018   CL 105 07/11/2018   CREATININE 0.83 07/11/2018   BUN 10 07/11/2018   CO2 23 07/11/2018   TSH 1.41 07/24/2017    US Pelvis (transabdominal Only)  Result Date: 07/12/2018 CLINICAL DATA:  Suspected right ovarian cyst on CT scan dated July 12, 2018 EXAM: TRANSABDOMINAL ULTRASOUND OF PELVIS TECHNIQUE: Transabdominal ultrasound examination of  the pelvis was performed including evaluation of the uterus, ovaries, adnexal regions, and pelvic cul-de-sac. COMPARISON:  Abdominal and pelvic CT scan of July 12, 2018 FINDINGS: Uterus Measurements: 8.3 x 3.6 x 5.3 cm. No fibroids or other mass visualized. Endometrium Thickness: 8.8 mm.  No focal abnormality visualized. Right ovary Measurements: 2.8 x 1.6 x 3.9 cm. No cystic or solid ovarian or adnexal mass is observed. Left ovary Measurements: 3.0 x 2.0 x 3.1 cm. No cystic or solid ovarian or adnexal mass is observed. Other findings:  There is a trace of free pelvic fluid. IMPRESSION: No cystic or solid ovarian or adnexal masses are demonstrated. Normal appearance of the uterus and endometrium. Trace of free pelvic fluid. Electronically Signed   By: David  [place] M.D.   On: 07/12/2018 16:13   Ct Abdomen Pelvis W Contrast  Result Date: 07/12/2018 CLINICAL DATA:  Generalized abdominal pain, sharp pain worsened with coughing, laughing and walking, negative pregnancy test EXAM: CT ABDOMEN AND PELVIS WITH CONTRAST TECHNIQUE: Multidetector CT imaging of the abdomen and pelvis was performed using the standard protocol following bolus administration of intravenous contrast. Sagittal and coronal MPR images reconstructed from axial data set. CONTRAST:  ISOVUE-300 IOPAMIDOL (ISOVUE-300) INJECTION 61% IV. No oral contrast. COMPARISON:  None FINDINGS: Lower chest: Lung bases clear Hepatobiliary: Gallbladder and liver normal appearance Pancreas: Normal appearance Spleen: Normal appearance Adrenals/Urinary Tract: Adrenal glands, kidneys, ureters, and bladder normal appearance Stomach/Bowel: Normal appendix in RIGHT pelvis. Stomach and bowel loops normal appearance. Vascular/Lymphatic: Vascular structures patent.  No adenopathy. Reproductive: Unremarkable uterus and LEFT adnexa. Amorphous RIGHT adnexal tissue. No definite discrete RIGHT ovarian mass is visualized but intermediate attenuation fluid is seen in the  cul-de-sac question blood, raising question of a ruptured RIGHT ovarian cyst. Other: No additional free fluid.  No free air.  No hernia. Musculoskeletal: Unremarkable IMPRESSION: Intermediate attenuation fluid in cul-de-sac question blood. Amorphous RIGHT adnexa, cannot exclude ruptured RIGHT ovarian cyst; recommend pelvic sonography to assess. Remainder of exam unremarkable. Electronically Signed   By: Ulyses Southward M.D.   On: 07/12/2018 02:07     Assessment & Plan:  Plan  I have discontinued Caylin Alcorta's albuterol, Levonorgestrel-Ethinyl Estradiol, and amoxicillin. I am also having her start on sulfamethoxazole-trimethoprim. Additionally, I am having her maintain her HYDROcodone-acetaminophen.  Meds ordered this encounter  Medications  . sulfamethoxazole-trimethoprim (BACTRIM) 400-80 MG tablet    Sig: Take 1 tablet by mouth 2 (two) times daily.    Dispense:  14 tablet    Refill:  0    Problem List Items Addressed This Visit    None    Visit Diagnoses    Flank pain    -  Primary   Relevant Medications   sulfamethoxazole-trimethoprim (BACTRIM) 400-80 MG tablet   Other Relevant Orders   POCT Urinalysis Dipstick (Automated) (Completed)   Urine Culture   CBC with Differential/Platelet   Comprehensive metabolic panel   Dysuria       Relevant Medications   sulfamethoxazole-trimethoprim (BACTRIM) 400-80 MG tablet   Other Relevant Orders  CBC with Differential/Platelet   Comprehensive metabolic panel    wbc elevated in er abx per orders  Recheck labs  rto if symptoms worsen  Follow-up: Return if symptoms worsen or fail to improve.  Donato Schultz, DO

## 2018-07-14 LAB — COMPREHENSIVE METABOLIC PANEL
ALBUMIN: 3.6 g/dL (ref 3.5–5.2)
ALK PHOS: 49 U/L (ref 39–117)
ALT: 7 U/L (ref 0–35)
AST: 11 U/L (ref 0–37)
BUN: 10 mg/dL (ref 6–23)
CO2: 27 mEq/L (ref 19–32)
Calcium: 8.8 mg/dL (ref 8.4–10.5)
Chloride: 102 mEq/L (ref 96–112)
Creatinine, Ser: 0.91 mg/dL (ref 0.40–1.20)
GFR: 99.54 mL/min (ref >60.00)
Glucose, Bld: 100 mg/dL — ABNORMAL HIGH (ref 70–99)
POTASSIUM: 4.3 meq/L (ref 3.5–5.1)
Sodium: 135 mEq/L (ref 135–145)
Total Bilirubin: 0.3 mg/dL (ref 0.2–1.2)
Total Protein: 6.4 g/dL (ref 6.0–8.3)

## 2018-07-14 LAB — CBC WITH DIFFERENTIAL/PLATELET
BASOS ABS: 0.1 10*3/uL (ref 0.0–0.1)
BASOS PCT: 1.1 % (ref 0.0–3.0)
EOS ABS: 0.1 10*3/uL (ref 0.0–0.7)
Eosinophils Relative: 1.3 % (ref 0.0–5.0)
HEMATOCRIT: 34.7 % — AB (ref 36.0–46.0)
Hemoglobin: 11.1 g/dL — ABNORMAL LOW (ref 12.0–15.0)
LYMPHS ABS: 1.6 10*3/uL (ref 0.7–4.0)
LYMPHS PCT: 24.7 % (ref 12.0–46.0)
MCHC: 31.9 g/dL (ref 30.0–36.0)
MCV: 74.2 fl — AB (ref 78.0–100.0)
Monocytes Absolute: 0.6 10*3/uL (ref 0.1–1.0)
Monocytes Relative: 9 % (ref 3.0–12.0)
NEUTROS ABS: 4.2 10*3/uL (ref 1.4–7.7)
Neutrophils Relative %: 63.9 % (ref 43.0–77.0)
Platelets: 392 10*3/uL (ref 150.0–400.0)
RBC: 4.68 Mil/uL (ref 3.87–5.11)
RDW: 16.7 % — ABNORMAL HIGH (ref 11.5–15.5)
WBC: 6.6 10*3/uL (ref 4.0–10.5)

## 2018-07-15 ENCOUNTER — Telehealth: Payer: Self-pay

## 2018-07-15 DIAGNOSIS — D649 Anemia, unspecified: Secondary | ICD-10-CM

## 2018-07-15 LAB — URINE CULTURE
MICRO NUMBER:: 91081673
SPECIMEN QUALITY: ADEQUATE

## 2018-07-15 MED ORDER — THERAPEUTIC MULTIVIT/MINERAL PO TABS: 1.0000 | ORAL_TABLET | Freq: Every day | ORAL | 1 refills | Status: DC

## 2018-07-15 NOTE — Telephone Encounter (Signed)
Author phoned pt. to review lab results. Mother answered, stated that pt. Is still having low back discomfort. Mother had to leave, however, and other results could not be discussed. Awaiting call back.

## 2018-07-15 NOTE — Telephone Encounter (Signed)
Mother called back. States while low back still hurts, the burning with urination has improved. Author encouraged mother to encourage daughter to finish the course of antibiotic, and recommended pt. start a multivitamin with iron d/t anemia. Mother verbalized understanding, med order placed. Mother knows to call back if low back pain persists after the completion of the antibiotic.

## 2018-07-15 NOTE — Telephone Encounter (Signed)
-----   Message from Donato SchultzYvonne R Lowne Chase, DO sent at 07/14/2018  8:55 PM EDT ----- Reina FuseWbc much better Pt is anemic--- take mult vitamin with iron daily  How is she feeling?

## 2019-01-07 ENCOUNTER — Ambulatory Visit: Payer: Self-pay | Admitting: Family Medicine

## 2019-01-10 ENCOUNTER — Ambulatory Visit (INDEPENDENT_AMBULATORY_CARE_PROVIDER_SITE_OTHER): Admitting: Family Medicine

## 2019-01-10 ENCOUNTER — Encounter: Payer: Self-pay | Admitting: Family Medicine

## 2019-01-10 VITALS — BP 110/80 | HR 74 | Temp 98.1°F | Ht 67.0 in | Wt 143.0 lb

## 2019-01-10 DIAGNOSIS — R1013 Epigastric pain: Secondary | ICD-10-CM | POA: Diagnosis not present

## 2019-01-10 LAB — H. PYLORI ANTIBODY, IGG: H Pylori IgG: NEGATIVE

## 2019-01-10 LAB — CBC WITH DIFFERENTIAL/PLATELET
BASOS ABS: 0 10*3/uL (ref 0.0–0.1)
Basophils Relative: 0.5 % (ref 0.0–3.0)
EOS ABS: 0 10*3/uL (ref 0.0–0.7)
Eosinophils Relative: 0.4 % (ref 0.0–5.0)
HEMATOCRIT: 30 % — AB (ref 36.0–46.0)
Hemoglobin: 9.4 g/dL — ABNORMAL LOW (ref 12.0–15.0)
LYMPHS PCT: 27.9 % (ref 12.0–46.0)
Lymphs Abs: 2.1 10*3/uL (ref 0.7–4.0)
MCHC: 31.3 g/dL (ref 30.0–36.0)
MCV: 67.3 fl — ABNORMAL LOW (ref 78.0–100.0)
Monocytes Absolute: 0.5 10*3/uL (ref 0.1–1.0)
Monocytes Relative: 6.4 % (ref 3.0–12.0)
NEUTROS ABS: 4.9 10*3/uL (ref 1.4–7.7)
Neutrophils Relative %: 64.8 % (ref 43.0–77.0)
Platelets: 403 10*3/uL — ABNORMAL HIGH (ref 150.0–400.0)
RBC: 4.46 Mil/uL (ref 3.87–5.11)
RDW: 18.1 % — ABNORMAL HIGH (ref 11.5–15.5)
WBC: 7.6 10*3/uL (ref 4.0–10.5)

## 2019-01-10 LAB — COMPREHENSIVE METABOLIC PANEL
ALK PHOS: 61 U/L (ref 39–117)
ALT: 10 U/L (ref 0–35)
AST: 15 U/L (ref 0–37)
Albumin: 3.9 g/dL (ref 3.5–5.2)
BILIRUBIN TOTAL: 0.6 mg/dL (ref 0.2–1.2)
BUN: 17 mg/dL (ref 6–23)
CO2: 26 mEq/L (ref 19–32)
CREATININE: 0.85 mg/dL (ref 0.40–1.20)
Calcium: 8.9 mg/dL (ref 8.4–10.5)
Chloride: 101 mEq/L (ref 96–112)
GFR: 100.87 mL/min (ref >60.00)
GLUCOSE: 80 mg/dL (ref 70–99)
Potassium: 4.1 mEq/L (ref 3.5–5.1)
SODIUM: 135 meq/L (ref 135–145)
TOTAL PROTEIN: 6.5 g/dL (ref 6.0–8.3)

## 2019-01-10 MED ORDER — HYOSCYAMINE SULFATE 0.125 MG SL SUBL
0.1250 mg | SUBLINGUAL_TABLET | Freq: Once | SUBLINGUAL | Status: AC
  Administered 2019-01-10: 0.125 mg via SUBLINGUAL

## 2019-01-10 MED ORDER — LIDOCAINE VISCOUS HCL 2 % MT SOLN
15.0000 mL | Freq: Once | OROMUCOSAL | Status: AC
  Administered 2019-01-10: 15 mL via OROMUCOSAL

## 2019-01-10 MED ORDER — OMEPRAZOLE 20 MG PO CPDR: 20.0000 mg | DELAYED_RELEASE_CAPSULE | Freq: Every day | ORAL | 3 refills | Status: DC

## 2019-01-10 MED ORDER — ALUM & MAG HYDROXIDE-SIMETH 400-400-40 MG/5ML PO SUSP
30.0000 mL | Freq: Once | ORAL | Status: AC
  Administered 2019-01-10: 30 mL via ORAL

## 2019-01-10 NOTE — Progress Notes (Signed)
Patient ID: Cynthia Horton, female    DOB: 1996-10-30  Age: 23 y.o. MRN: [id number]    Subjective:  Subjective  HPI Gennell Franca presents for midepigastric pain and chest pain x few weeks.  Her mom gave her otc antacid with some relief.  Pt denies radiation to shoulder/ neck.  She denies burping  Pt was seen in ER in [place] and had xrays and labs -- all was neg.    Review of Systems  Constitutional: Negative for appetite change, diaphoresis, fatigue and unexpected weight change.  Eyes: Negative for pain, redness and visual disturbance.  Respiratory: Negative for cough, chest tightness, shortness of breath and wheezing.   Cardiovascular: Positive for chest pain. Negative for palpitations and leg swelling.  Gastrointestinal: Positive for nausea. Negative for abdominal distention and abdominal pain.  Endocrine: Negative for cold intolerance, heat intolerance, polydipsia, polyphagia and polyuria.  Genitourinary: Negative for difficulty urinating, dysuria and frequency.  Neurological: Negative for dizziness, light-headedness, numbness and headaches.    History Past Medical History:  Diagnosis Date  . Dysmenorrhea in adolescent     She has a past surgical history that includes Hernia repair and Tonsillectomy.   Her family history includes Breast cancer in her maternal grandmother and paternal grandmother; Diabetes in her maternal grandfather; Hypertension in her maternal grandfather and mother; Lung cancer in her paternal grandmother.She reports that she has never smoked. She has never used smokeless tobacco. She reports current alcohol use. She reports that she does not use drugs.  No current outpatient medications on file prior to visit.   No current facility-administered medications on file prior to visit.      Objective:  Objective  Physical Exam Vitals signs and nursing note reviewed.  Constitutional:      Appearance: She is well-developed.  HENT:     Head:  Normocephalic and atraumatic.  Eyes:     Conjunctiva/sclera: Conjunctivae normal.  Neck:     Musculoskeletal: Normal range of motion and neck supple.     Thyroid: No thyromegaly.     Vascular: No carotid bruit or JVD.  Cardiovascular:     Rate and Rhythm: Normal rate and regular rhythm.     Heart sounds: Normal heart sounds. No murmur.  Pulmonary:     Effort: Pulmonary effort is normal. No respiratory distress.     Breath sounds: Normal breath sounds. No wheezing or rales.  Chest:     Chest wall: No tenderness.  Abdominal:     Palpations: Abdomen is soft.     Tenderness: There is abdominal tenderness in the epigastric area. There is no guarding or rebound. Negative signs include Murphy's sign, Rovsing's sign, McBurney's sign, psoas sign and obturator sign.  Neurological:     Mental Status: She is alert and oriented to person, place, and time.    BP 110/80   Pulse 74   Temp 98.1 F (36.7 C)   Ht 5\' 7"  (1.702 m)   Wt 143 lb (64.9 kg)   SpO2 98%   BMI 22.40 kg/m  Wt Readings from Last 3 Encounters:  01/10/19 143 lb (64.9 kg)  07/13/18 144 lb (65.3 kg)  07/11/18 135 lb (61.2 kg)     Lab Results  Component Value Date   WBC 6.6 07/13/2018   HGB 11.1 (L) 07/13/2018   HCT 34.7 (L) 07/13/2018   PLT 392.0 07/13/2018   GLUCOSE 100 (H) 07/13/2018   CHOL 97 10/02/2014   TRIG 39 10/02/2014   HDL 49 10/02/2014   LDLCALC  40 10/02/2014   ALT 7 07/13/2018   AST 11 07/13/2018   NA 135 07/13/2018   K 4.3 07/13/2018   CL 102 07/13/2018   CREATININE 0.91 07/13/2018   BUN 10 07/13/2018   CO2 27 07/13/2018   TSH 1.41 07/24/2017    US Pelvis (transabdominal Only)  Result Date: 07/12/2018 CLINICAL DATA:  Suspected right ovarian cyst on CT scan dated July 12, 2018 EXAM: TRANSABDOMINAL ULTRASOUND OF PELVIS TECHNIQUE: Transabdominal ultrasound examination of the pelvis was performed including evaluation of the uterus, ovaries, adnexal regions, and pelvic cul-de-sac. COMPARISON:   Abdominal and pelvic CT scan of July 12, 2018 FINDINGS: Uterus Measurements: 8.3 x 3.6 x 5.3 cm. No fibroids or other mass visualized. Endometrium Thickness: 8.8 mm.  No focal abnormality visualized. Right ovary Measurements: 2.8 x 1.6 x 3.9 cm. No cystic or solid ovarian or adnexal mass is observed. Left ovary Measurements: 3.0 x 2.0 x 3.1 cm. No cystic or solid ovarian or adnexal mass is observed. Other findings:  There is a trace of free pelvic fluid. IMPRESSION: No cystic or solid ovarian or adnexal masses are demonstrated. Normal appearance of the uterus and endometrium. Trace of free pelvic fluid. Electronically Signed   By: David  [place] M.D.   On: 07/12/2018 16:13   Ct Abdomen Pelvis W Contrast  Result Date: 07/12/2018 CLINICAL DATA:  Generalized abdominal pain, sharp pain worsened with coughing, laughing and walking, negative pregnancy test EXAM: CT ABDOMEN AND PELVIS WITH CONTRAST TECHNIQUE: Multidetector CT imaging of the abdomen and pelvis was performed using the standard protocol following bolus administration of intravenous contrast. Sagittal and coronal MPR images reconstructed from axial data set. CONTRAST:  ISOVUE-300 IOPAMIDOL (ISOVUE-300) INJECTION 61% IV. No oral contrast. COMPARISON:  None FINDINGS: Lower chest: Lung bases clear Hepatobiliary: Gallbladder and liver normal appearance Pancreas: Normal appearance Spleen: Normal appearance Adrenals/Urinary Tract: Adrenal glands, kidneys, ureters, and bladder normal appearance Stomach/Bowel: Normal appendix in RIGHT pelvis. Stomach and bowel loops normal appearance. Vascular/Lymphatic: Vascular structures patent.  No adenopathy. Reproductive: Unremarkable uterus and LEFT adnexa. Amorphous RIGHT adnexal tissue. No definite discrete RIGHT ovarian mass is visualized but intermediate attenuation fluid is seen in the cul-de-sac question blood, raising question of a ruptured RIGHT ovarian cyst. Other: No additional free fluid.  No free air.   No hernia. Musculoskeletal: Unremarkable IMPRESSION: Intermediate attenuation fluid in cul-de-sac question blood. Amorphous RIGHT adnexa, cannot exclude ruptured RIGHT ovarian cyst; recommend pelvic sonography to assess. Remainder of exam unremarkable. Electronically Signed   By: Ulyses Southward M.D.   On: 07/12/2018 02:07     Assessment & Plan:  Plan  I have discontinued Reannon Rappa's HYDROcodone-acetaminophen, sulfamethoxazole-trimethoprim, and therapeutic multivitamin-minerals. I am also having her start on omeprazole. We administered alum & mag hydroxide-simeth, lidocaine, and hyoscyamine.  Meds ordered this encounter  Medications  . omeprazole (PRILOSEC) 20 MG capsule    Sig: Take 1 capsule (20 mg total) by mouth daily.    Dispense:  30 capsule    Refill:  3  . alum & mag hydroxide-simeth (MAALOX PLUS) 400-400-40 MG/5ML suspension 30 mL  . lidocaine (XYLOCAINE) 2 % viscous mouth solution 15 mL  . hyoscyamine (LEVSIN SL) SL tablet 0.125 mg    Problem List Items Addressed This Visit    None    Visit Diagnoses    Midepigastric pain    -  Primary   Relevant Medications   omeprazole (PRILOSEC) 20 MG capsule   alum & mag hydroxide-simeth (MAALOX PLUS) 400-400-40 MG/5ML suspension  30 mL (Completed)   lidocaine (XYLOCAINE) 2 % viscous mouth solution 15 mL (Completed)   hyoscyamine (LEVSIN SL) SL tablet 0.125 mg (Completed)   Other Relevant Orders   CBC with Differential/Platelet   H. pylori antibody, IgG   Comprehensive metabolic panel    pt had relief with GI cocktail Omeprazole sent to pharmacy Check labs Consider GI referral   Follow-up: Return if symptoms worsen or fail to improve.  Donato Schultz, DO

## 2019-01-10 NOTE — Patient Instructions (Signed)

## 2019-01-19 ENCOUNTER — Other Ambulatory Visit: Payer: Self-pay | Admitting: *Deleted

## 2019-01-19 DIAGNOSIS — D649 Anemia, unspecified: Secondary | ICD-10-CM

## 2019-01-20 ENCOUNTER — Other Ambulatory Visit (INDEPENDENT_AMBULATORY_CARE_PROVIDER_SITE_OTHER)

## 2019-01-20 ENCOUNTER — Other Ambulatory Visit: Payer: Self-pay

## 2019-01-20 DIAGNOSIS — D649 Anemia, unspecified: Secondary | ICD-10-CM | POA: Diagnosis not present

## 2019-01-20 LAB — CBC WITH DIFFERENTIAL/PLATELET
Basophils Absolute: 0 10*3/uL (ref 0.0–0.1)
Basophils Relative: 0.5 % (ref 0.0–3.0)
EOS PCT: 0.4 % (ref 0.0–5.0)
Eosinophils Absolute: 0 10*3/uL (ref 0.0–0.7)
HCT: 31.1 % — ABNORMAL LOW (ref 36.0–46.0)
HEMOGLOBIN: 9.6 g/dL — AB (ref 12.0–15.0)
Lymphocytes Relative: 35 % (ref 12.0–46.0)
Lymphs Abs: 2.3 10*3/uL (ref 0.7–4.0)
MCHC: 30.7 g/dL (ref 30.0–36.0)
MCV: 67.3 fl — AB (ref 78.0–100.0)
Monocytes Absolute: 0.6 10*3/uL (ref 0.1–1.0)
Monocytes Relative: 9.2 % (ref 3.0–12.0)
Neutro Abs: 3.7 10*3/uL (ref 1.4–7.7)
Neutrophils Relative %: 54.9 % (ref 43.0–77.0)
Platelets: 474 10*3/uL — ABNORMAL HIGH (ref 150.0–400.0)
RBC: 4.62 Mil/uL (ref 3.87–5.11)
RDW: 17.9 % — ABNORMAL HIGH (ref 11.5–15.5)
WBC: 6.7 10*3/uL (ref 4.0–10.5)

## 2019-01-21 ENCOUNTER — Telehealth: Payer: Self-pay | Admitting: Family Medicine

## 2019-01-21 NOTE — Telephone Encounter (Signed)
Copied from CRM [id number]. Topic: Quick Communication - See Telephone Encounter >> Jan 21, 2019  1:38 PM Arlyss Gandy, NT wrote: CRM for notification. See Telephone encounter for: 01/21/19. Pt returning call to the office to receive lab results. States to please call her at [phone].

## 2019-01-26 NOTE — Telephone Encounter (Signed)
See lab notes

## 2019-07-20 ENCOUNTER — Telehealth: Payer: Self-pay | Admitting: Family Medicine

## 2019-07-20 NOTE — Telephone Encounter (Signed)
Copied from [place] [id number]. Topic: General - Other >> Jul 20, 2019  9:45 AM Keene Breath wrote: Reason for CRM: Patient called to request a referral to an ortho specialist.  Patient stated that she went to the ER after breaking her leg and they told her to see an orthopedic doctor within 3 days.  Please advise and call patient to let her know if she can get a referral as soon as possible.  CB# [phone]

## 2019-07-20 NOTE — Telephone Encounter (Signed)
We need records from er--- where did she go --- there is nothing in epic Ortho is going to want the records and xrays

## 2019-07-20 NOTE — Telephone Encounter (Signed)
Pt states they told her she had a "strong fracture of the left leg". Pt states leg is currently wrapped in a splint. That's all she could give me

## 2019-07-20 NOTE — Telephone Encounter (Signed)
Pt went to the ED in [facility] [place], where she is in school.  Her mom picked her up today and brought her home. Pt was in ED until 3:20 am this morning.  Pt has the info from her ED visit, and instructed to see an orthopedist w/in 3 days. But she does not have her x rays.  Cb:  [phone]

## 2019-07-20 NOTE — Telephone Encounter (Signed)
We need to know exactly what she broke to put referral I--- if they don't have xrays--- ortho will do them again

## 2019-07-20 NOTE — Telephone Encounter (Signed)
Left VM for patient to call back

## 2019-07-20 NOTE — Telephone Encounter (Signed)
I need to know exactly what she broke to put in the referral --- I can not say broken leg Does their info say exactly what she broke?     Ok to put referral in then

## 2019-07-20 NOTE — Telephone Encounter (Signed)
Does she need a office/ virtual visit prior to referral?

## 2019-07-21 ENCOUNTER — Other Ambulatory Visit: Payer: Self-pay | Admitting: Family Medicine

## 2019-07-21 DIAGNOSIS — S8292XA Unspecified fracture of left lower leg, initial encounter for closed fracture: Secondary | ICD-10-CM

## 2019-07-21 NOTE — Telephone Encounter (Signed)
I put referral in  

## 2019-07-21 NOTE — Progress Notes (Signed)
.  ortho

## 2020-03-21 ENCOUNTER — Telehealth: Payer: Self-pay

## 2020-03-21 NOTE — Telephone Encounter (Signed)
Spoke with pt's mom. [facility] went to [facility] for [facility]

## 2020-03-21 NOTE — Telephone Encounter (Signed)
Nurse Assessment Nurse: Nunzio Cory, RN, Sherrie Date/Time (Eastern Time): 03/20/2020 5:52:22 PM Confirm and document reason for call. If symptomatic, describe symptoms. ---Caller states daughter is having lot of mouth pain right now. Thinks maybe an abscess tooth. Dtr is away at college and not with her. Has the patient had close contact with a person known or suspected to have the novel coronavirus illness OR traveled / lives in area with major community spread (including international travel) in the last 14 days from the onset of symptoms? * If Asymptomatic, screen for exposure and travel within the last 14 days. ---No Does the patient have any new or worsening symptoms? ---Yes Will a triage be completed? ---No Select reason for no triage. ---Other Please document clinical information provided and list any resource used. ---Explained to caller unable to triage dtr since she she is not with her. States dtr did not want to wait at [facility] for 4 hours. Explained better that to wait all night for appointment tomorrow. Verbalized understanding. Guidelines Guideline Title Affirmed Question Affirmed Notes Nurse Date/Time (Eastern Time) Disp. Time Lamount Cohen Time) Disposition Final User 03/20/2020 5:56:28 PM Clinical Call Yes Nunzio Cory, RN, Sherrie

## 2020-04-20 ENCOUNTER — Ambulatory Visit (INDEPENDENT_AMBULATORY_CARE_PROVIDER_SITE_OTHER): Admitting: Family Medicine

## 2020-04-20 ENCOUNTER — Other Ambulatory Visit: Payer: Self-pay

## 2020-04-20 ENCOUNTER — Encounter: Payer: Self-pay | Admitting: Family Medicine

## 2020-04-20 VITALS — BP 100/60 | HR 94 | Temp 97.2°F | Resp 18 | Ht 67.0 in | Wt 148.6 lb

## 2020-04-20 DIAGNOSIS — Z0000 Encounter for general adult medical examination without abnormal findings: Secondary | ICD-10-CM

## 2020-04-20 DIAGNOSIS — D649 Anemia, unspecified: Secondary | ICD-10-CM

## 2020-04-20 DIAGNOSIS — N926 Irregular menstruation, unspecified: Secondary | ICD-10-CM

## 2020-04-20 DIAGNOSIS — Z23 Encounter for immunization: Secondary | ICD-10-CM

## 2020-04-20 LAB — LIPID PANEL
Cholesterol: 111 mg/dL (ref 0–200)
HDL: 58.6 mg/dL (ref >39.00)
LDL Cholesterol: 45 mg/dL (ref 0–99)
NonHDL: 52.38
Total CHOL/HDL Ratio: 2
Triglycerides: 35 mg/dL (ref 0.0–149.0)
VLDL: 7 mg/dL (ref 0.0–40.0)

## 2020-04-20 LAB — CBC WITH DIFFERENTIAL/PLATELET
Basophils Absolute: 0.1 10*3/uL (ref 0.0–0.1)
Basophils Relative: 0.7 % (ref 0.0–3.0)
Eosinophils Absolute: 0 10*3/uL (ref 0.0–0.7)
Eosinophils Relative: 0.6 % (ref 0.0–5.0)
HCT: 30.2 % — ABNORMAL LOW (ref 36.0–46.0)
Hemoglobin: 9.1 g/dL — ABNORMAL LOW (ref 12.0–15.0)
Lymphocytes Relative: 30.2 % (ref 12.0–46.0)
Lymphs Abs: 2.3 10*3/uL (ref 0.7–4.0)
MCHC: 30.2 g/dL (ref 30.0–36.0)
MCV: 65.2 fl — ABNORMAL LOW (ref 78.0–100.0)
Monocytes Absolute: 0.6 10*3/uL (ref 0.1–1.0)
Monocytes Relative: 8.2 % (ref 3.0–12.0)
Neutro Abs: 4.5 10*3/uL (ref 1.4–7.7)
Neutrophils Relative %: 60.3 % (ref 43.0–77.0)
Platelets: 393 10*3/uL (ref 150.0–400.0)
RBC: 4.63 Mil/uL (ref 3.87–5.11)
RDW: 18.7 % — ABNORMAL HIGH (ref 11.5–15.5)
WBC: 7.5 10*3/uL (ref 4.0–10.5)

## 2020-04-20 LAB — COMPREHENSIVE METABOLIC PANEL
ALT: 8 U/L (ref 0–35)
AST: 13 U/L (ref 0–37)
Albumin: 4.2 g/dL (ref 3.5–5.2)
Alkaline Phosphatase: 64 U/L (ref 39–117)
BUN: 10 mg/dL (ref 6–23)
CO2: 28 mEq/L (ref 19–32)
Calcium: 9.1 mg/dL (ref 8.4–10.5)
Chloride: 105 mEq/L (ref 96–112)
Creatinine, Ser: 0.83 mg/dL (ref 0.40–1.20)
GFR: 102.52 mL/min (ref >60.00)
Glucose, Bld: 84 mg/dL (ref 70–99)
Potassium: 4.1 mEq/L (ref 3.5–5.1)
Sodium: 137 mEq/L (ref 135–145)
Total Bilirubin: 0.4 mg/dL (ref 0.2–1.2)
Total Protein: 6.7 g/dL (ref 6.0–8.3)

## 2020-04-20 LAB — IBC + FERRITIN
Ferritin: 4.4 ng/mL — ABNORMAL LOW (ref 10.0–291.0)
Iron: 12 ug/dL — ABNORMAL LOW (ref 42–145)
Saturation Ratios: 2.6 % — ABNORMAL LOW (ref 20.0–50.0)
Transferrin: 328 mg/dL (ref 212.0–360.0)

## 2020-04-20 LAB — TSH: TSH: 1.62 u[IU]/mL (ref 0.35–4.50)

## 2020-04-20 NOTE — Progress Notes (Signed)
Subjective:     Cynthia Horton is a 24 y.o. female and is here for a comprehensive physical exam. The patient reports problems - irregular periods and pain in leg / foot --- L leg-- this is the ankle she broke in September .  Social History   Socioeconomic History  . Marital status: Single    Spouse name: Not on file  . Number of children: Not on file  . Years of education: Not on file  . Highest education level: Not on file  Occupational History  . Occupation: Airline pilot    Comment: [employer]  Tobacco Use  . Smoking status: Never Smoker  . Smokeless tobacco: Never Used  Vaping Use  . Vaping Use: Never used  Substance and Sexual Activity  . Alcohol use: Yes    Alcohol/week: 4.0 standard drinks    Types: 4 Shots of liquor per week    Comment: occasional  . Drug use: Yes    Types: Marijuana    Comment: 2 x a year   . Sexual activity: Never    Birth control/protection: Pill    Comment: stopped taking early August 2019  Other Topics Concern  . Not on file  Social History Narrative   Lives with mother in a two story home.  No children.   Education: in college.  Exercise-No   Social Determinants of Corporate investment banker Strain:   . Difficulty of Paying Living Expenses:   Food Insecurity:   . Worried About Programme researcher, broadcasting/film/video in the Last Year:   . Barista in the Last Year:   Transportation Needs:   . Freight forwarder (Medical):   Marland Kitchen Lack of Transportation (Non-Medical):   Physical Activity:   . Days of Exercise per Week:   . Minutes of Exercise per Session:   Stress:   . Feeling of Stress :   Social Connections:   . Frequency of Communication with Friends and Family:   . Frequency of Social Gatherings with Friends and Family:   . Attends Religious Services:   . Active Member of Clubs or Organizations:   . Attends Banker Meetings:   Marland Kitchen Marital Status:   Intimate Partner Violence:   . Fear of Current or Ex-Partner:   . Emotionally  Abused:   Marland Kitchen Physically Abused:   . Sexually Abused:    Health Maintenance  Topic Date Due  . Hepatitis C Screening  Never done  . HIV Screening  Never done  . TETANUS/TDAP  Never done  . PAP-Cervical Cytology Screening  Never done  . PAP SMEAR-Modifier  Never done  . INFLUENZA VACCINE  06/03/2020    The following portions of the patient's history were reviewed and updated as appropriate:  She  has a past medical history of Dysmenorrhea in adolescent. She does not have any pertinent problems on file. She  Her family history includes Breast cancer in her maternal grandmother and paternal grandmother; Diabetes in her maternal grandfather; Hypertension in her maternal grandfather and mother; Lung cancer in her paternal grandmother. She  reports that she has never smoked. She has never used smokeless tobacco. She reports current alcohol use of about 4.0 standard drinks of alcohol per week. She reports current drug use. Drug: Marijuana. She currently has no medications in their medication list. No current outpatient medications on file prior to visit.   No current facility-administered medications on file prior to visit.   She is allergic to naproxen.Marland Kitchen  Review of Systems Review of Systems  Constitutional: Negative for activity change, appetite change and fatigue.  HENT: Negative for hearing loss, congestion, tinnitus and ear discharge.  dentist q61m Eyes: Negative for visual disturbance (see optho q1y -- vision corrected to 20/20 with glasses).  Respiratory: Negative for cough, chest tightness and shortness of breath.   Cardiovascular: Negative for chest pain, palpitations and leg swelling.  Gastrointestinal: Negative for abdominal pain, diarrhea, constipation and abdominal distention.  Genitourinary: Negative for urgency, frequency, decreased urine volume and difficulty urinating. +irregular periods Musculoskeletal: Negative for back pain, arthralgias and gait problem.  Skin: Negative  for color change, pallor and rash.  Neurological: Negative for dizziness, light-headedness, numbness and headaches.  Hematological: Negative for adenopathy. Does not bruise/bleed easily.  Psychiatric/Behavioral: Negative for suicidal ideas, confusion, sleep disturbance, self-injury, dysphoric mood, decreased concentration and agitation.       Objective:    BP 100/60 (BP Location: Left Arm, Patient Position: Sitting, Cuff Size: Normal)   Pulse 94   Temp (!) 97.2 F (36.2 C) (Temporal)   Resp 18   Ht 5\' 7"  (1.702 m)   Wt 148 lb 9.6 oz (67.4 kg)   LMP 04/16/2020   SpO2 97%   BMI 23.27 kg/m  General appearance: alert, cooperative, appears stated age and no distress Head: Normocephalic, without obvious abnormality, atraumatic Eyes: negative findings: lids and lashes normal, conjunctivae and sclerae normal and pupils equal, round, reactive to light and accomodation Ears: normal TM's and external ear canals both ears Neck: no adenopathy, no carotid bruit, no JVD, supple, symmetrical, trachea midline and thyroid not enlarged, symmetric, no tenderness/mass/nodules Back: symmetric, no curvature. ROM normal. No CVA tenderness. Lungs: clear to auscultation bilaterally Breasts: gyn Heart: regular rate and rhythm, S1, S2 normal, no murmur, click, rub or gallop Abdomen: soft, non-tender; bowel sounds normal; no masses,  no organomegaly Pelvic: deferred --gyn Extremities: extremities normal, atraumatic, no cyanosis or edema Pulses: 2+ and symmetric Skin: Skin color, texture, turgor normal. No rashes or lesions Lymph nodes: Cervical, supraclavicular, and axillary nodes normal. Neurologic: Alert and oriented X 3, normal strength and tone. Normal symmetric reflexes. Normal coordination and gait    Assessment:    Healthy female exam.      Plan:     ghm utd--- pt will get covid vaccine,   Men B #2 and hep A#2 given today Check labs See After Visit Summary for Counseling Recommendations      1. Preventative health care See above  - CBC with Differential/Platelet - Lipid panel - TSH - Comprehensive metabolic panel - IBC + Ferritin  2. Irregular periods Check labs  Refer to gyn Pt need pap  Hx anemia from heavy periods  - CBC with Differential/Platelet - IBC + Ferritin - Ambulatory referral to Obstetrics / Gynecology  3. Need for hepatitis A immunization  - Hepatitis A vaccine adult IM  4. Need for meningitis vaccination  - Meningococcal B, OMV

## 2020-04-20 NOTE — Patient Instructions (Signed)
Preventive Care 21-24 Years Old, Female Preventive care refers to visits with your health care provider and lifestyle choices that can promote health and wellness. This includes:  A yearly physical exam. This may also be called an annual well check.  Regular dental visits and eye exams.  Immunizations.  Screening for certain conditions.  Healthy lifestyle choices, such as eating a healthy diet, getting regular exercise, not using drugs or products that contain nicotine and tobacco, and limiting alcohol use. What can I expect for my preventive care visit? Physical exam Your health care provider will check your:  Height and weight. This may be used to calculate body mass index (BMI), which tells if you are at a healthy weight.  Heart rate and blood pressure.  Skin for abnormal spots. Counseling Your health care provider may ask you questions about your:  Alcohol, tobacco, and drug use.  Emotional well-being.  Home and relationship well-being.  Sexual activity.  Eating habits.  Work and work environment.  Method of birth control.  Menstrual cycle.  Pregnancy history. What immunizations do I need?  Influenza (flu) vaccine  This is recommended every year. Tetanus, diphtheria, and pertussis (Tdap) vaccine  You may need a Td booster every 10 years. Varicella (chickenpox) vaccine  You may need this if you have not been vaccinated. Human papillomavirus (HPV) vaccine  If recommended by your health care provider, you may need three doses over 6 months. Measles, mumps, and rubella (MMR) vaccine  You may need at least one dose of MMR. You may also need a second dose. Meningococcal conjugate (MenACWY) vaccine  One dose is recommended if you are age 19-21 years and a first-year college student living in a residence hall, or if you have one of several medical conditions. You may also need additional booster doses. Pneumococcal conjugate (PCV13) vaccine  You may need  this if you have certain conditions and were not previously vaccinated. Pneumococcal polysaccharide (PPSV23) vaccine  You may need one or two doses if you smoke cigarettes or if you have certain conditions. Hepatitis A vaccine  You may need this if you have certain conditions or if you travel or work in places where you may be exposed to hepatitis A. Hepatitis B vaccine  You may need this if you have certain conditions or if you travel or work in places where you may be exposed to hepatitis B. Haemophilus influenzae type b (Hib) vaccine  You may need this if you have certain conditions. You may receive vaccines as individual doses or as more than one vaccine together in one shot (combination vaccines). Talk with your health care provider about the risks and benefits of combination vaccines. What tests do I need?  Blood tests  Lipid and cholesterol levels. These may be checked every 5 years starting at age 20.  Hepatitis C test.  Hepatitis B test. Screening  Diabetes screening. This is done by checking your blood sugar (glucose) after you have not eaten for a while (fasting).  Sexually transmitted disease (STD) testing.  BRCA-related cancer screening. This may be done if you have a family history of breast, ovarian, tubal, or peritoneal cancers.  Pelvic exam and Pap test. This may be done every 3 years starting at age 21. Starting at age 30, this may be done every 5 years if you have a Pap test in combination with an HPV test. Talk with your health care provider about your test results, treatment options, and if necessary, the need for more tests.   Follow these instructions at home: Eating and drinking   Eat a diet that includes fresh fruits and vegetables, whole grains, lean protein, and low-fat dairy.  Take vitamin and mineral supplements as recommended by your health care provider.  Do not drink alcohol if: ? Your health care provider tells you not to drink. ? You are  pregnant, may be pregnant, or are planning to become pregnant.  If you drink alcohol: ? Limit how much you have to 0-1 drink a day. ? Be aware of how much alcohol is in your drink. In the [place] one drink equals one 12 oz bottle of beer (355 mL), one 5 oz glass of wine (148 mL), or one 1 oz glass of hard liquor (44 mL). Lifestyle  Take daily care of your teeth and gums.  Stay active. Exercise for at least 30 minutes on 5 or more days each week.  Do not use any products that contain nicotine or tobacco, such as cigarettes, e-cigarettes, and chewing tobacco. If you need help quitting, ask your health care provider.  If you are sexually active, practice safe sex. Use a condom or other form of birth control (contraception) in order to prevent pregnancy and STIs (sexually transmitted infections). If you plan to become pregnant, see your health care provider for a preconception visit. What's next?  Visit your health care provider once a year for a well check visit.  Ask your health care provider how often you should have your eyes and teeth checked.  Stay up to date on all vaccines. This information is not intended to replace advice given to you by your health care provider. Make sure you discuss any questions you have with your health care provider. Document Revised: 07/01/2018 Document Reviewed: 07/01/2018 Elsevier Patient Education  2020 [employer].

## 2020-04-24 ENCOUNTER — Other Ambulatory Visit: Payer: Self-pay | Admitting: Family Medicine

## 2020-04-24 DIAGNOSIS — D509 Iron deficiency anemia, unspecified: Secondary | ICD-10-CM

## 2020-04-24 NOTE — Addendum Note (Signed)
Addended byConrad [place] D on: 04/24/2020 01:36 PM   Modules accepted: Orders

## 2020-10-08 ENCOUNTER — Ambulatory Visit (INDEPENDENT_AMBULATORY_CARE_PROVIDER_SITE_OTHER): Payer: Self-pay | Admitting: Family Medicine

## 2020-10-08 ENCOUNTER — Encounter: Payer: Self-pay | Admitting: Family Medicine

## 2020-10-08 ENCOUNTER — Other Ambulatory Visit: Payer: Self-pay

## 2020-10-08 ENCOUNTER — Other Ambulatory Visit (HOSPITAL_COMMUNITY)
Admission: RE | Admit: 2020-10-08 | Discharge: 2020-10-08 | Disposition: A | Discharge status: Home / Self Care | Payer: Self-pay | Source: Ambulatory Visit | Attending: Family Medicine | Admitting: Family Medicine

## 2020-10-08 VITALS — BP 98/80 | HR 82 | Temp 98.0°F | Resp 18 | Ht 67.0 in | Wt 149.8 lb

## 2020-10-08 DIAGNOSIS — R8290 Unspecified abnormal findings in urine: Secondary | ICD-10-CM

## 2020-10-08 DIAGNOSIS — N898 Other specified noninflammatory disorders of vagina: Secondary | ICD-10-CM | POA: Insufficient documentation

## 2020-10-08 LAB — POC URINALSYSI DIPSTICK (AUTOMATED)
Bilirubin, UA: NEGATIVE
Blood, UA: NEGATIVE
Glucose, UA: NEGATIVE
Ketones, UA: NEGATIVE
Nitrite, UA: NEGATIVE
Protein, UA: NEGATIVE
Spec Grav, UA: 1.015 (ref 1.010–1.025)
Urobilinogen, UA: 0.2 E.U./dL
pH, UA: 7.5 (ref 5.0–8.0)

## 2020-10-08 MED ORDER — FLUCONAZOLE 150 MG PO TABS: ORAL_TABLET | ORAL | 0 refills | Status: DC

## 2020-10-08 NOTE — Patient Instructions (Signed)
Vaginitis Vaginitis is a condition in which the vaginal tissue swells and becomes red (inflamed). This condition is most often caused by a change in the normal balance of bacteria and yeast that live in the vagina. This change causes an overgrowth of certain bacteria or yeast, which causes the inflammation. There are different types of vaginitis, but the most common types are:  Bacterial vaginosis.  Yeast infection (candidiasis).  Trichomoniasis vaginitis. This is a sexually transmitted disease (STD).  Viral vaginitis.  Atrophic vaginitis.  Allergic vaginitis. What are the causes? The cause of this condition depends on the type of vaginitis. It can be caused by:  Bacteria (bacterial vaginosis).  Yeast, which is a fungus (yeast infection).  A parasite (trichomoniasis vaginitis).  A virus (viral vaginitis).  Low hormone levels (atrophic vaginitis). Low hormone levels can occur during pregnancy, breastfeeding, or after menopause.  Irritants, such as bubble baths, scented tampons, and feminine sprays (allergic vaginitis). Other factors can change the normal balance of the yeast and bacteria that live in the vagina. These include:  Antibiotic medicines.  Poor hygiene.  Diaphragms, vaginal sponges, spermicides, birth control pills, and intrauterine devices (IUD).  Sex.  Infection.  Uncontrolled diabetes.  A weakened defense (immune) system. What increases the risk? This condition is more likely to develop in women who:  Smoke.  Use vaginal douches, scented tampons, or scented sanitary pads.  Wear tight-fitting pants.  Wear thong underwear.  Use oral birth control pills or an IUD.  Have sex without a condom.  Have multiple sex partners.  Have an STD.  Frequently use the spermicide nonoxynol-9.  Eat lots of foods high in sugar.  Have uncontrolled diabetes.  Have low estrogen levels.  Have a weakened immune system from an immune disorder or medical  treatment.  Are pregnant or breastfeeding. What are the signs or symptoms? Symptoms vary depending on the cause of the vaginitis. Common symptoms include:  Abnormal vaginal discharge. ? The discharge is white, gray, or yellow with bacterial vaginosis. ? The discharge is thick, white, and cheesy with a yeast infection. ? The discharge is frothy and yellow or greenish with trichomoniasis.  A bad vaginal smell. The smell is fishy with bacterial vaginosis.  Vaginal itching, pain, or swelling.  Sex that is painful.  Pain or burning when urinating. Sometimes there are no symptoms. How is this diagnosed? This condition is diagnosed based on your symptoms and medical history. A physical exam, including a pelvic exam, will also be done. You may also have other tests, including:  Tests to determine the pH level (acidity or alkalinity) of your vagina.  A whiff test, to assess the odor that results when a sample of your vaginal discharge is mixed with a potassium hydroxide solution.  Tests of vaginal fluid. A sample will be examined under a microscope. How is this treated? Treatment varies depending on the type of vaginitis you have. Your treatment may include:  Antibiotic creams or pills to treat bacterial vaginosis and trichomoniasis.  Antifungal medicines, such as vaginal creams or suppositories, to treat a yeast infection.  Medicine to ease discomfort if you have viral vaginitis. Your sexual partner should also be treated.  Estrogen delivered in a cream, pill, suppository, or vaginal ring to treat atrophic vaginitis. If vaginal dryness occurs, lubricants and moisturizing creams may help. You may need to avoid scented soaps, sprays, or douches.  Stopping use of a product that is causing allergic vaginitis. Then using a vaginal cream to treat the symptoms. Follow   these instructions at home: Lifestyle  Keep your genital area clean and dry. Avoid soap, and only rinse the area with  water.  Do not douche or use tampons until your health care provider says it is okay to do so. Use sanitary pads, if needed.  Do not have sex until your health care provider approves. When you can return to sex, practice safe sex and use condoms.  Wipe from front to back. This avoids the spread of bacteria from the rectum to the vagina. General instructions  Take over-the-counter and prescription medicines only as told by your health care provider.  If you were prescribed an antibiotic medicine, take or use it as told by your health care provider. Do not stop taking or using the antibiotic even if you start to feel better.  Keep all follow-up visits as told by your health care provider. This is important. How is this prevented?  Use mild, non-scented products. Do not use things that can irritate the vagina, such as fabric softeners. Avoid the following products if they are scented: ? Feminine sprays. ? Detergents. ? Tampons. ? Feminine hygiene products. ? Soaps or bubble baths.  Let air reach your genital area. ? Wear cotton underwear to reduce moisture buildup. ? Avoid wearing underwear while you sleep. ? Avoid wearing tight pants and underwear or nylons without a cotton panel. ? Avoid wearing thong underwear.  Take off any wet clothing, such as bathing suits, as soon as possible.  Practice safe sex and use condoms. Contact a health care provider if:  You have abdominal pain.  You have a fever.  You have symptoms that last for more than 2-3 days. Get help right away if:  You have a fever and your symptoms suddenly get worse. Summary  Vaginitis is a condition in which the vaginal tissue becomes inflamed.This condition is most often caused by a change in the normal balance of bacteria and yeast that live in the vagina.  Treatment varies depending on the type of vaginitis you have.  Do not douche, use tampons , or have sex until your health care provider approves. When  you can return to sex, practice safe sex and use condoms. This information is not intended to replace advice given to you by your health care provider. Make sure you discuss any questions you have with your health care provider. Document Revised: 10/02/2017 Document Reviewed: 11/25/2016 Elsevier Patient Education  2020 [employer]  

## 2020-10-08 NOTE — Assessment & Plan Note (Signed)
Self swab done for cervico ancillary testing ua done---+ leuk--- culture pending Diflucan sent in  rto for pelvic exam if no improvemet

## 2020-10-08 NOTE — Progress Notes (Signed)
Patient ID: Alayzha An, female    DOB: October 09, 1996  Age: 24 y.o. MRN: [id number]    Subjective:  Subjective  HPI Jannatul Wojdyla presents for possible yeast infection ---  C/o thick white d/c vaginally and itching.  No odor.  Pt is not sexually active.  No abd pain   Review of Systems  Constitutional: Negative for appetite change, diaphoresis, fatigue and unexpected weight change.  Eyes: Negative for pain, redness and visual disturbance.  Respiratory: Negative for cough, chest tightness, shortness of breath and wheezing.   Cardiovascular: Negative for chest pain, palpitations and leg swelling.  Endocrine: Negative for cold intolerance, heat intolerance, polydipsia, polyphagia and polyuria.  Genitourinary: Negative for difficulty urinating, dysuria and frequency.  Neurological: Negative for dizziness, light-headedness, numbness and headaches.    History Past Medical History:  Diagnosis Date  . Dysmenorrhea in adolescent     She has a past surgical history that includes Hernia repair and Tonsillectomy.   Her family history includes Breast cancer in her maternal grandmother and paternal grandmother; Diabetes in her maternal grandfather; Hypertension in her maternal grandfather and mother; Lung cancer in her paternal grandmother.She reports that she has never smoked. She has never used smokeless tobacco. She reports current alcohol use of about 4.0 standard drinks of alcohol per week. She reports current drug use. Drug: Marijuana.  No current outpatient medications on file prior to visit.   No current facility-administered medications on file prior to visit.     Objective:  Objective  Physical Exam Vitals and nursing note reviewed.  Constitutional:      Appearance: She is well-developed.  HENT:     Head: Normocephalic and atraumatic.  Eyes:     Conjunctiva/sclera: Conjunctivae normal.  Neck:     Thyroid: No thyromegaly.     Vascular: No carotid bruit or JVD.    Cardiovascular:     Rate and Rhythm: Normal rate and regular rhythm.     Heart sounds: Normal heart sounds. No murmur heard.   Pulmonary:     Effort: Pulmonary effort is normal. No respiratory distress.     Breath sounds: Normal breath sounds. No wheezing or rales.  Chest:     Chest wall: No tenderness.  Musculoskeletal:     Cervical back: Normal range of motion and neck supple.  Neurological:     Mental Status: She is alert and oriented to person, place, and time.    BP 98/80 (BP Location: Right Arm, Patient Position: Sitting, Cuff Size: Normal)   Pulse 82   Temp 98 F (36.7 C) (Oral)   Resp 18   Ht 5\' 7"  (1.702 m)   Wt 149 lb 12.8 oz (67.9 kg)   SpO2 99%   BMI 23.46 kg/m  Wt Readings from Last 3 Encounters:  10/08/20 149 lb 12.8 oz (67.9 kg)  04/20/20 148 lb 9.6 oz (67.4 kg)  01/10/19 143 lb (64.9 kg)     Lab Results  Component Value Date   WBC 7.5 04/20/2020   HGB 9.1 (L) 04/20/2020   HCT 30.2 (L) 04/20/2020   PLT 393.0 04/20/2020   GLUCOSE 84 04/20/2020   CHOL 111 04/20/2020   TRIG 35.0 04/20/2020   HDL 58.60 04/20/2020   LDLCALC 45 04/20/2020   ALT 8 04/20/2020   AST 13 04/20/2020   NA 137 04/20/2020   K 4.1 04/20/2020   CL 105 04/20/2020   CREATININE 0.83 04/20/2020   BUN 10 04/20/2020   CO2 28 04/20/2020   TSH 1.62 04/20/2020  US PELVIS (TRANSABDOMINAL ONLY)  Result Date: 07/12/2018 CLINICAL DATA:  Suspected right ovarian cyst on CT scan dated July 12, 2018 EXAM: TRANSABDOMINAL ULTRASOUND OF PELVIS TECHNIQUE: Transabdominal ultrasound examination of the pelvis was performed including evaluation of the uterus, ovaries, adnexal regions, and pelvic cul-de-sac. COMPARISON:  Abdominal and pelvic CT scan of July 12, 2018 FINDINGS: Uterus Measurements: 8.3 x 3.6 x 5.3 cm. No fibroids or other mass visualized. Endometrium Thickness: 8.8 mm.  No focal abnormality visualized. Right ovary Measurements: 2.8 x 1.6 x 3.9 cm. No cystic or solid ovarian or  adnexal mass is observed. Left ovary Measurements: 3.0 x 2.0 x 3.1 cm. No cystic or solid ovarian or adnexal mass is observed. Other findings:  There is a trace of free pelvic fluid. IMPRESSION: No cystic or solid ovarian or adnexal masses are demonstrated. Normal appearance of the uterus and endometrium. Trace of free pelvic fluid. Electronically Signed   By: David  [place] M.D.   On: 07/12/2018 16:13   CT ABDOMEN PELVIS W CONTRAST  Result Date: 07/12/2018 CLINICAL DATA:  Generalized abdominal pain, sharp pain worsened with coughing, laughing and walking, negative pregnancy test EXAM: CT ABDOMEN AND PELVIS WITH CONTRAST TECHNIQUE: Multidetector CT imaging of the abdomen and pelvis was performed using the standard protocol following bolus administration of intravenous contrast. Sagittal and coronal MPR images reconstructed from axial data set. CONTRAST:  ISOVUE-300 IOPAMIDOL (ISOVUE-300) INJECTION 61% IV. No oral contrast. COMPARISON:  None FINDINGS: Lower chest: Lung bases clear Hepatobiliary: Gallbladder and liver normal appearance Pancreas: Normal appearance Spleen: Normal appearance Adrenals/Urinary Tract: Adrenal glands, kidneys, ureters, and bladder normal appearance Stomach/Bowel: Normal appendix in RIGHT pelvis. Stomach and bowel loops normal appearance. Vascular/Lymphatic: Vascular structures patent.  No adenopathy. Reproductive: Unremarkable uterus and LEFT adnexa. Amorphous RIGHT adnexal tissue. No definite discrete RIGHT ovarian mass is visualized but intermediate attenuation fluid is seen in the cul-de-sac question blood, raising question of a ruptured RIGHT ovarian cyst. Other: No additional free fluid.  No free air.  No hernia. Musculoskeletal: Unremarkable IMPRESSION: Intermediate attenuation fluid in cul-de-sac question blood. Amorphous RIGHT adnexa, cannot exclude ruptured RIGHT ovarian cyst; recommend pelvic sonography to assess. Remainder of exam unremarkable. Electronically Signed   By:  Ulyses Southward M.D.   On: 07/12/2018 02:07     Assessment & Plan:  Plan  I am having Tyja Sibert start on fluconazole.  Meds ordered this encounter  Medications  . fluconazole (DIFLUCAN) 150 MG tablet    Sig: 1 po x1, may repeat in 3 days prn    Dispense:  2 tablet    Refill:  0    Problem List Items Addressed This Visit      Unprioritized   Vaginal discharge - Primary    Self swab done for cervico ancillary testing ua done---+ leuk--- culture pending Diflucan sent in  rto for pelvic exam if no improvemet       Relevant Medications   fluconazole (DIFLUCAN) 150 MG tablet   Other Relevant Orders   POCT Urinalysis Dipstick (Automated) (Completed)   Cervicovaginal ancillary only( Datto)    Other Visit Diagnoses    Abnormal urine finding       Relevant Orders   Urine Culture      Follow-up: Return if symptoms worsen or fail to improve.  Donato Schultz, DO

## 2020-10-09 LAB — CERVICOVAGINAL ANCILLARY ONLY
Bacterial Vaginitis (gardnerella): POSITIVE — AB
Candida Glabrata: NEGATIVE
Candida Vaginitis: POSITIVE — AB
Chlamydia: NEGATIVE
Comment: NEGATIVE
Comment: NEGATIVE
Comment: NEGATIVE
Comment: NEGATIVE
Comment: NEGATIVE
Comment: NORMAL
Neisseria Gonorrhea: NEGATIVE
Trichomonas: NEGATIVE

## 2020-10-10 ENCOUNTER — Other Ambulatory Visit: Payer: Self-pay

## 2020-10-10 MED ORDER — METRONIDAZOLE 500 MG PO TABS
500.0000 mg | ORAL_TABLET | Freq: Two times a day (BID) | ORAL | 0 refills | Status: DC
Start: 2020-10-10 — End: 2021-02-06

## 2020-10-11 LAB — URINE CULTURE
MICRO NUMBER:: 11280592
SPECIMEN QUALITY:: ADEQUATE

## 2020-10-12 ENCOUNTER — Other Ambulatory Visit: Payer: Self-pay

## 2020-10-12 MED ORDER — NITROFURANTOIN MONOHYD MACRO 100 MG PO CAPS
100.0000 mg | ORAL_CAPSULE | Freq: Two times a day (BID) | ORAL | 0 refills | Status: DC
Start: 2020-10-12 — End: 2021-02-06

## 2021-01-14 ENCOUNTER — Other Ambulatory Visit: Payer: Self-pay

## 2021-01-14 ENCOUNTER — Telehealth (INDEPENDENT_AMBULATORY_CARE_PROVIDER_SITE_OTHER): Payer: Self-pay | Admitting: Family Medicine

## 2021-01-14 ENCOUNTER — Encounter: Payer: Self-pay | Admitting: Family Medicine

## 2021-01-14 VITALS — Ht 67.0 in | Wt 140.0 lb

## 2021-01-14 DIAGNOSIS — J440 Chronic obstructive pulmonary disease with acute lower respiratory infection: Secondary | ICD-10-CM

## 2021-01-14 DIAGNOSIS — J209 Acute bronchitis, unspecified: Secondary | ICD-10-CM

## 2021-01-14 MED ORDER — PREDNISONE 10 MG PO TABS: ORAL_TABLET | ORAL | 0 refills | Status: DC

## 2021-01-14 MED ORDER — PROMETHAZINE-DM 6.25-15 MG/5ML PO SYRP: 5.0000 mL | ORAL_SOLUTION | Freq: Four times a day (QID) | ORAL | 0 refills | Status: DC | PRN

## 2021-01-14 MED ORDER — AZITHROMYCIN 250 MG PO TABS: ORAL_TABLET | ORAL | 0 refills | Status: DC

## 2021-01-14 NOTE — Progress Notes (Signed)
     Virtual Visit via Video Note  I connected with Cynthia Horton on 01/14/21 at  3:20 PM EDT by a video enabled telemedicine application and verified that I am speaking with the correct person using two identifiers.  Location/ participants in ov  Patient: home alone  Provider: office    I discussed the limitations of evaluation and management by telemedicine and the availability of in person appointments. The patient expressed understanding and agreed to proceed.  History of Present Illness: Pt is c/o 1 month-- dry , no fevers.   Neg covid 2/17   She was taking tylenol and nyquil with no relief.   Cough keeps her awake and is causing chest pain    Observations/Objective: There were no vitals filed for this visit. No fever Pt is coughing ,  In nad  Assessment and Plan: 1. Acute bronchitis with COPD (HCC) z pak, pred taper and cough med Take aleve for pain F/u in 3-4 days if no better  - azithromycin (ZITHROMAX Z-PAK) 250 MG tablet; As directed  Dispense: 6 each; Refill: 0 - predniSONE (DELTASONE) 10 MG tablet; TAKE 3 TABLETS PO QD FOR 3 DAYS THEN TAKE 2 TABLETS PO QD FOR 3 DAYS THEN TAKE 1 TABLET PO QD FOR 3 DAYS THEN TAKE 1/2 TAB PO QD FOR 3 DAYS  Dispense: 20 tablet; Refill: 0 - promethazine-dextromethorphan (PROMETHAZINE-DM) 6.25-15 MG/5ML syrup; Take 5 mLs by mouth 4 (four) times daily as needed.  Dispense: 118 mL; Refill: 0  Follow Up Instructions:    I discussed the assessment and treatment plan with the patient. The patient was provided an opportunity to ask questions and all were answered. The patient agreed with the plan and demonstrated an understanding of the instructions.   The patient was advised to call back or seek an in-person evaluation if the symptoms worsen or if the condition fails to improve as anticipated.    Donato Schultz, DO

## 2021-02-06 ENCOUNTER — Ambulatory Visit (INDEPENDENT_AMBULATORY_CARE_PROVIDER_SITE_OTHER): Payer: Self-pay | Admitting: Internal Medicine

## 2021-02-06 ENCOUNTER — Other Ambulatory Visit: Payer: Self-pay

## 2021-02-06 ENCOUNTER — Ambulatory Visit (HOSPITAL_BASED_OUTPATIENT_CLINIC_OR_DEPARTMENT_OTHER)
Admission: RE | Admit: 2021-02-06 | Discharge: 2021-02-06 | Disposition: A | Discharge status: Home / Self Care | Payer: Self-pay | Source: Ambulatory Visit | Attending: Internal Medicine | Admitting: Internal Medicine

## 2021-02-06 ENCOUNTER — Encounter: Payer: Self-pay | Admitting: Internal Medicine

## 2021-02-06 VITALS — BP 116/74 | HR 97 | Temp 98.2°F | Resp 18 | Ht 67.0 in | Wt 142.1 lb

## 2021-02-06 DIAGNOSIS — R053 Chronic cough: Secondary | ICD-10-CM | POA: Insufficient documentation

## 2021-02-06 MED ORDER — PANTOPRAZOLE SODIUM 40 MG PO TBEC
40.0000 mg | DELAYED_RELEASE_TABLET | Freq: Every day | ORAL | 0 refills | Status: AC
Start: 2021-02-06 — End: ?

## 2021-02-06 NOTE — Progress Notes (Signed)
Subjective:    Patient ID: Cynthia Horton, female    DOB: 12-03-1995, 25 y.o.   MRN: [id number]  DOS:  02/06/2021 Type of visit - description: Acute  Started with cough around mid January 2022. The cough has been dry, does not recall having URI with the onset of symptoms. In January had a negative Covid test.  Seen by PCP virtually 01/14/2021,  Was Rx Zithromax, prednisone, Promethazine DM. Overall the cough has decreased but is not gone.  Worse at night, continue to be dry except for the last few days, she thinks she is coughing up postnasal dripping mucus which is clear.  Review of Systems Denies fever chills. No weight loss. Occasionally left-sided chest discomfort, she thinks due to cough. No shortness of breath, she continue exercising regularly without problems No GERD type of symptoms. No wheezing    Past Medical History:  Diagnosis Date  . Dysmenorrhea in adolescent     Past Surgical History:  Procedure Laterality Date  . HERNIA REPAIR     Umbilical (age 58)  . TONSILLECTOMY     age 66    Allergies as of 02/06/2021      Reactions   Naproxen Other (See Comments)   Chest tightness      Medication List       Accurate as of February 06, 2021 11:59 PM. If you have any questions, ask your nurse or doctor.        STOP taking these medications   azithromycin 250 MG tablet Commonly known as: Zithromax Z-Pak Stopped by: Willow Ora, MD   fluconazole 150 MG tablet Commonly known as: DIFLUCAN Stopped by: Willow Ora, MD   metroNIDAZOLE 500 MG tablet Commonly known as: Flagyl Stopped by: Willow Ora, MD   nitrofurantoin (macrocrystal-monohydrate) 100 MG capsule Commonly known as: Macrobid Stopped by: Willow Ora, MD   predniSONE 10 MG tablet Commonly known as: DELTASONE Stopped by: Willow Ora, MD   promethazine-dextromethorphan 6.25-15 MG/5ML syrup Commonly known as: PROMETHAZINE-DM Stopped by: Willow Ora, MD     TAKE these medications   pantoprazole 40 MG  tablet Commonly known as: PROTONIX Take 1 tablet (40 mg total) by mouth daily before breakfast. Started by: Willow Ora, MD          Objective:   Physical Exam BP 116/74 (BP Location: Left Arm, Patient Position: Sitting, Cuff Size: Small)   Pulse 97   Temp 98.2 F (36.8 C) (Oral)   Resp 18   Ht 5\' 7"  (1.702 m)   Wt 142 lb 2 oz (64.5 kg)   LMP 01/30/2021 (Approximate)   SpO2 98%   BMI 22.26 kg/m  General:   Well developed, NAD, BMI noted. HEENT:  Normocephalic . Face symmetric, atraumatic. TMs: Slightly bulged bilaterally but not red Nose minimally congested. Sinuses: No TTP Lungs:  CTA B Normal respiratory effort, no intercostal retractions, no accessory muscle use. Heart: RRR,  no murmur.  Lower extremities: no pretibial edema bilaterally  Skin: Not pale. Not jaundice Neurologic:  alert & oriented X3.  Speech normal, gait appropriate for age and unassisted Psych--  Cognition and judgment appear intact.  Cooperative with normal attention span and concentration.  Behavior appropriate. No anxious or depressed appearing.      Assessment    25 year old female presents with  Chronic cough: Symptoms a started approximately mid January 2022, had antibiotics, prednisone approximately 3 weeks ago with some help but she continue with cough. She admits to some postnasal dripping. At some point  she was told she has acid reflux but no recent heartburn. Plan: Chest x-ray (not on BCPs, LMP 1 week ago, not sexually active) Trial with Flonase, pantoprazole. If not better by the end of the trial she will call, pulmonary referral?.    This visit occurred during the SARS-CoV-2 public health emergency.  Safety protocols were in place, including screening questions prior to the visit, additional usage of staff PPE, and extensive cleaning of exam room while observing appropriate contact time as indicated for disinfecting solutions.

## 2021-02-06 NOTE — Patient Instructions (Signed)
Go to the first floor and proceed with your chest x-ray  Get over-the-counter Flonase, a nasal spray: 2 sprays on each side of the nose every night for 1 month  Take the medication called pantoprazole 40 mg 1 tablet before breakfast every day for 1 month  Take Mucinex DM over-the-counter as needed for cough control.  If your cough is not gone in 3 to 4 weeks, please call the office, will refer you to a specialist.

## 2021-05-25 ENCOUNTER — Encounter: Payer: Self-pay | Admitting: Family Medicine

## 2021-05-27 ENCOUNTER — Other Ambulatory Visit: Payer: Self-pay | Admitting: Family Medicine

## 2021-05-27 MED ORDER — ALBUTEROL SULFATE HFA 108 (90 BASE) MCG/ACT IN AERS: 2.0000 | INHALATION_SPRAY | RESPIRATORY_TRACT | 0 refills | Status: DC | PRN

## 2021-05-27 NOTE — Telephone Encounter (Signed)
Okay to refill the Albuterol inhaler?

## 2022-01-07 ENCOUNTER — Other Ambulatory Visit: Payer: Self-pay

## 2022-01-07 ENCOUNTER — Encounter (HOSPITAL_BASED_OUTPATIENT_CLINIC_OR_DEPARTMENT_OTHER): Payer: Self-pay

## 2022-01-07 ENCOUNTER — Emergency Department (HOSPITAL_BASED_OUTPATIENT_CLINIC_OR_DEPARTMENT_OTHER): Payer: Self-pay

## 2022-01-07 ENCOUNTER — Emergency Department (HOSPITAL_BASED_OUTPATIENT_CLINIC_OR_DEPARTMENT_OTHER)
Admission: EM | Admit: 2022-01-07 | Discharge: 2022-01-07 | Disposition: A | Discharge status: Home / Self Care | Payer: Self-pay | Attending: Emergency Medicine | Admitting: Emergency Medicine

## 2022-01-07 DIAGNOSIS — Y9341 Activity, dancing: Secondary | ICD-10-CM | POA: Insufficient documentation

## 2022-01-07 DIAGNOSIS — W010XXA Fall on same level from slipping, tripping and stumbling without subsequent striking against object, initial encounter: Secondary | ICD-10-CM | POA: Insufficient documentation

## 2022-01-07 DIAGNOSIS — S8392XA Sprain of unspecified site of left knee, initial encounter: Secondary | ICD-10-CM | POA: Insufficient documentation

## 2022-01-07 MED ORDER — HYDROCODONE-ACETAMINOPHEN 5-325 MG PO TABS: 1.0000 | ORAL_TABLET | Freq: Four times a day (QID) | ORAL | 0 refills | Status: DC | PRN

## 2022-01-07 NOTE — ED Provider Notes (Signed)
?[facility] EMERGENCY DEPARTMENT ?Provider Note ? ? ?CSN: [id number] ?Arrival date & time: 01/07/22  2841 ? ?  ? ?History ? ?Chief Complaint  ?Patient presents with  ? Leg Injury  ? ? ?Cynthia Horton is a 26 y.o. female. ? ?The history is provided by the patient.  ?Knee Pain ?Location:  Knee ?Time since incident:  3 days ?Injury: yes   ?Mechanism of injury: fall   ?Knee location:  L knee ?Pain details:  ?  Quality:  Aching ?  Radiates to:  Does not radiate ?  Severity:  Moderate ?  Onset quality:  Sudden ?  Timing:  Constant ?  Progression:  Unchanged ?Chronicity:  New ?Relieved by:  Rest ?Worsened by:  Activity ?Associated symptoms: no muscle weakness   ? ?Patient reports she was dancing about 3 days ago when she slipped and fell.  She fell directly right on her knee.  Since that time it is hard to walk.  She reports she had swelling but that is improved ?No previous surgery to her left leg ? ?Home Medications ?Prior to Admission medications   ?Medication Sig Start Date End Date Taking? Authorizing Provider  ?HYDROcodone-acetaminophen (NORCO/VICODIN) 5-325 MG tablet Take 1 tablet by mouth every 6 (six) hours as needed for severe pain. 01/07/22  Yes Zadie Rhine, MD  ?albuterol (VENTOLIN HFA) 108 (90 Base) MCG/ACT inhaler Inhale 2 puffs into the lungs every 4 (four) hours as needed for wheezing or shortness of breath. 05/27/21   Zola Button, Grayling Congress, DO  ?pantoprazole (PROTONIX) 40 MG tablet Take 1 tablet (40 mg total) by mouth daily before breakfast. 02/06/21   Wanda Plump, MD  ?   ? ?Allergies    ?Naproxen   ? ?Review of Systems   ?Review of Systems  ?Musculoskeletal:  Positive for arthralgias.  ?Neurological:  Negative for weakness.  ? ?Physical Exam ?Updated Vital Signs ?BP (!) 129/93 (BP Location: Right Arm)   Pulse 85   Temp 97.9 ?F (36.6 ?C) (Oral)   Resp 18   Ht 1.702 m (5\' 7" )   Wt 60.8 kg   LMP 12/24/2021 (Exact Date)   SpO2 100%   BMI 20.99 kg/m?  ?Physical Exam ?CONSTITUTIONAL: Well  developed/well nourished ?HEAD: Normocephalic/atraumatic ?EYES: EOMI ?NECK: supple no meningeal signs ?LUNGS: no apparent distress ?NEURO: Pt is awake/alert/appropriate, moves all extremitiesx4.  No facial droop.   ?EXTREMITIES: pulses normal/equal, full ROM ?Distal pulses equal and intact in both legs.  Left Achilles is intact.  There is no tenderness to the left knee or ankle.  No tenderness with range of motion of left hip. ?Tenderness to palpation of the left patella.  No deformities.  No bruising.  Minimal swelling is noted.  No erythema. ?She is able to flex hip and keep left knee extended without difficulty.  She is able to flex the left knee. ?SKIN: warm, color normal ?PSYCH: no abnormalities of mood noted, alert and oriented to situation ? ?ED Results / Procedures / Treatments   ?Labs ?(all labs ordered are listed, but only abnormal results are displayed) ?Labs Reviewed - No data to display ? ?EKG ?None ? ?Radiology ?DG Knee Complete 4 Views Left ? ?Result Date: 01/07/2022 ?CLINICAL DATA:  Increasing left knee pain for 3 days. EXAM: LEFT KNEE - COMPLETE 4+ VIEW COMPARISON:  None. FINDINGS: No evidence of fracture, dislocation, or joint effusion. No evidence of arthropathy or other focal bone abnormality. Soft tissues are unremarkable. IMPRESSION: Negative. Electronically Signed   By:  Tiburcio Pea M.D.   On: 01/07/2022 05:45   ? ?Procedures ?Procedures  ? ? ?Medications Ordered in ED ?Medications - No data to display ? ?ED Course/ Medical Decision Making/ A&P ?  ?                        ?Medical Decision Making ?Amount and/or Complexity of Data Reviewed ?Radiology: ordered. ? ?Risk ?Prescription drug management. ? ? ?Patient presents after falling directly on her left knee about 3 days ago.  I personally viewed the x-ray and there is no acute fractures. ?Overall her clinical exam is unremarkable except for mild tenderness. ?We will give crutches, advised ice, rest and elevation.  If no improvement in a week  she can follow-up with sports med.  Due to her NSAID allergy, will give short course of Vicodin for pain ?Patient appears to have an acute injury and can be managed as an outpatient ? ? ? ? ? ? ? ?Final Clinical Impression(s) / ED Diagnoses ?Final diagnoses:  ?Sprain of left knee, unspecified ligament, initial encounter  ? ? ?Rx / DC Orders ?ED Discharge Orders   ? ?      Ordered  ?  HYDROcodone-acetaminophen (NORCO/VICODIN) 5-325 MG tablet  Every 6 hours PRN       ? 01/07/22 0558  ? ?  ?  ? ?  ? ? ?  ?Zadie Rhine, MD ?01/07/22 4193 ? ?

## 2022-01-07 NOTE — ED Notes (Signed)
Pt to xray via wheelchair w rad tech

## 2022-01-07 NOTE — ED Triage Notes (Signed)
Pt ambulatory to room. Pt states she was dancing three days ago and felt something move in left knee. Pt was unable to dance after that. Since then pain has been increasing. Has taken tylenol, last dose 2200 last night. ?

## 2022-05-15 ENCOUNTER — Ambulatory Visit: Payer: Self-pay | Admitting: Medical

## 2022-07-09 ENCOUNTER — Telehealth: Payer: Self-pay | Admitting: Physician Assistant

## 2022-07-09 DIAGNOSIS — J020 Streptococcal pharyngitis: Secondary | ICD-10-CM

## 2022-07-09 MED ORDER — AMOXICILLIN 500 MG PO CAPS
500.0000 mg | ORAL_CAPSULE | Freq: Two times a day (BID) | ORAL | 0 refills | Status: AC
Start: 2022-07-09 — End: 2022-07-19

## 2022-07-09 NOTE — Progress Notes (Signed)
Virtual Visit Consent   Cynthia Horton, you are scheduled for a virtual visit with a [facility] provider today. Just as with appointments in the office, your consent must be obtained to participate. Your consent will be active for this visit and any virtual visit you may have with one of our providers in the next 365 days. If you have a MyChart account, a copy of this consent can be sent to you electronically.  As this is a virtual visit, video technology does not allow for your provider to perform a traditional examination. This may limit your provider's ability to fully assess your condition. If your provider identifies any concerns that need to be evaluated in person or the need to arrange testing (such as labs, EKG, etc.), we will make arrangements to do so. Although advances in technology are sophisticated, we cannot ensure that it will always work on either your end or our end. If the connection with a video visit is poor, the visit may have to be switched to a telephone visit. With either a video or telephone visit, we are not always able to ensure that we have a secure connection.  By engaging in this virtual visit, you consent to the provision of healthcare and authorize for your insurance to be billed (if applicable) for the services provided during this visit. Depending on your insurance coverage, you may receive a charge related to this service.  I need to obtain your verbal consent now. Are you willing to proceed with your visit today? [name] has provided verbal consent on 07/09/2022 for a virtual visit (video or telephone). Margaretann Loveless, PA-C  Date: 07/09/2022 2:10 PM  Virtual Visit via Video Note   I, Margaretann Loveless, connected with  Cynthia Horton  ([id number], 1996-04-19) on 07/09/22 at  2:00 PM EDT by a video-enabled telemedicine application and verified that I am speaking with the correct person using two identifiers.  Location: Patient: Virtual  Visit Location Patient: Home Provider: Virtual Visit Location Provider: Home Office   I discussed the limitations of evaluation and management by telemedicine and the availability of in person appointments. The patient expressed understanding and agreed to proceed.    History of Present Illness: Cynthia Horton is a 26 y.o. who identifies as a female who was assigned female at birth, and is being seen today for sore throat and ear pain.  HPI: Sore Throat  This is a new problem. The current episode started in the past 7 days (Monday night). The problem has been gradually worsening. The maximum temperature recorded prior to her arrival was 101 - 101.9 F. The fever has been present for Less than 1 day. The pain is moderate. Associated symptoms include congestion, ear pain, headaches, a plugged ear sensation, swollen glands and trouble swallowing. Pertinent negatives include no coughing, drooling or ear discharge. Associated symptoms comments: Chills and sweats last night. She has had exposure to strep. She has tried acetaminophen for the symptoms. The treatment provided no relief.      Problems:  Patient Active Problem List   Diagnosis Date Noted   Vaginal discharge 10/08/2020   Strep throat 01/29/2018   Exercise-induced asthma 10/02/2014   Dysmenorrhea 10/02/2014   Anemia 10/02/2014   Bronchospasm, exercise-induced 11/24/2013    Allergies:  Allergies  Allergen Reactions   Naproxen Other (See Comments)    Chest tightness   Medications:  Current Outpatient Medications:    amoxicillin (AMOXIL) 500 MG capsule, Take 1 capsule (  500 mg total) by mouth 2 (two) times daily for 10 days., Disp: 20 capsule, Rfl: 0   albuterol (VENTOLIN HFA) 108 (90 Base) MCG/ACT inhaler, Inhale 2 puffs into the lungs every 4 (four) hours as needed for wheezing or shortness of breath., Disp: 1 each, Rfl: 0   HYDROcodone-acetaminophen (NORCO/VICODIN) 5-325 MG tablet, Take 1 tablet by mouth every 6 (six) hours as  needed for severe pain., Disp: 5 tablet, Rfl: 0   pantoprazole (PROTONIX) 40 MG tablet, Take 1 tablet (40 mg total) by mouth daily before breakfast., Disp: 30 tablet, Rfl: 0  Observations/Objective: Patient is well-developed, well-nourished in no acute distress.  Resting comfortably  at home.  Head is normocephalic, atraumatic.  No labored breathing.  Speech is clear and coherent with logical content.  Patient is alert and oriented at baseline.    Assessment and Plan: 1. Strep pharyngitis - amoxicillin (AMOXIL) 500 MG capsule; Take 1 capsule (500 mg total) by mouth 2 (two) times daily for 10 days.  Dispense: 20 capsule; Refill: 0  - Suspect strep throat - Amoxicillin prescribed - Tylenol and Ibuprofen alternating every 4 hours - Salt water gargles - Chloraseptic spray - Liquid and soft food diet - Push fluids - New toothbrush in 3 days - Seek in person evaluation if not improving or if symptoms worsen   Follow Up Instructions: I discussed the assessment and treatment plan with the patient. The patient was provided an opportunity to ask questions and all were answered. The patient agreed with the plan and demonstrated an understanding of the instructions.  A copy of instructions were sent to the patient via MyChart unless otherwise noted below.    The patient was advised to call back or seek an in-person evaluation if the symptoms worsen or if the condition fails to improve as anticipated.  Time:  I spent 10 minutes with the patient via telehealth technology discussing the above problems/concerns.    Margaretann Loveless, PA-C

## 2022-07-09 NOTE — Patient Instructions (Signed)
[name], thank you for joining Margaretann Loveless, PA-C for today's virtual visit.  While this provider is not your primary care provider (PCP), if your PCP is located in our provider database this encounter information will be shared with them immediately following your visit.  Consent: (Patient) [name] provided verbal consent for this virtual visit at the beginning of the encounter.  Current Medications:  Current Outpatient Medications:    amoxicillin (AMOXIL) 500 MG capsule, Take 1 capsule (500 mg total) by mouth 2 (two) times daily for 10 days., Disp: 20 capsule, Rfl: 0   albuterol (VENTOLIN HFA) 108 (90 Base) MCG/ACT inhaler, Inhale 2 puffs into the lungs every 4 (four) hours as needed for wheezing or shortness of breath., Disp: 1 each, Rfl: 0   HYDROcodone-acetaminophen (NORCO/VICODIN) 5-325 MG tablet, Take 1 tablet by mouth every 6 (six) hours as needed for severe pain., Disp: 5 tablet, Rfl: 0   pantoprazole (PROTONIX) 40 MG tablet, Take 1 tablet (40 mg total) by mouth daily before breakfast., Disp: 30 tablet, Rfl: 0   Medications ordered in this encounter:  Meds ordered this encounter  Medications   amoxicillin (AMOXIL) 500 MG capsule    Sig: Take 1 capsule (500 mg total) by mouth 2 (two) times daily for 10 days.    Dispense:  20 capsule    Refill:  0    Order Specific Question:   Supervising Provider    Answer:   Hyacinth Meeker, BRIAN [[id number]]     *If you need refills on other medications prior to your next appointment, please contact your pharmacy*  Follow-Up: Call back or seek an in-person evaluation if the symptoms worsen or if the condition fails to improve as anticipated.  Other Instructions  Strep Throat, Adult Strep throat is an infection in the throat that is caused by bacteria. It is common during the cold months of the year. It mostly affects children who are 3-44 years old. However, people of all ages can get it at any time of the year. This  infection spreads from person to person (is contagious) through coughing, sneezing, or having close contact. Your health care provider may use other names to describe the infection. When strep throat affects the tonsils, it is called tonsillitis. When it affects the back of the throat, it is called pharyngitis. What are the causes? This condition is caused by the Streptococcus pyogenes bacteria. What increases the risk? You are more likely to develop this condition if: You care for school-age children, or are around school-age children. Children are more likely to get strep throat and may spread it to others. You spend time in crowded places where the infection can spread easily. You have close contact with someone who has strep throat. What are the signs or symptoms? Symptoms of this condition include: Fever or chills. Redness, swelling, or pain in the tonsils or throat. Pain or difficulty when swallowing. White or yellow spots on the tonsils or throat. Tender glands in the neck and under the jaw. Bad smelling breath. Red rash all over the body. This is rare. How is this diagnosed? This condition is diagnosed by tests that check for the presence and the amount of bacteria that cause strep throat. They are: Rapid strep test. Your throat is swabbed and checked for the presence of bacteria. Results are usually ready in minutes. Throat culture test. Your throat is swabbed. The sample is placed in a cup that allows infections to grow. Results are usually ready  in 1 or 2 days. How is this treated? This condition may be treated with: Medicines that kill germs (antibiotics). Medicines that relieve pain or fever. These include: Ibuprofen or acetaminophen. Aspirin, only for people who are over the age of 46. Throat lozenges. Throat sprays. Follow these instructions at home: Medicines  Take over-the-counter and prescription medicines only as told by your health care provider. Take your  antibiotic medicine as told by your health care provider. Do not stop taking the antibiotic even if you start to feel better. Eating and drinking  If you have trouble swallowing, try eating soft foods until your sore throat feels better. Drink enough fluid to keep your urine pale yellow. To help relieve pain, you may have: Warm fluids, such as soup and tea. Cold fluids, such as frozen desserts or popsicles. General instructions Gargle with a salt-water mixture 3-4 times a day or as needed. To make a salt-water mixture, completely dissolve -1 tsp (3-6 g) of salt in 1 cup (237 mL) of warm water. Get plenty of rest. Stay home from work or school until you have been taking antibiotics for 24 hours. Do not use any products that contain nicotine or tobacco. These products include cigarettes, chewing tobacco, and vaping devices, such as e-cigarettes. If you need help quitting, ask your health care provider. It is up to you to get your test results. Ask your health care provider, or the department that is doing the test, when your results will be ready. Keep all follow-up visits. This is important. How is this prevented?  Do not share food, drinking cups, or personal items that could cause the infection to spread to other people. Wash your hands often with soap and water for at least 20 seconds. If soap and water are not available, use hand sanitizer. Make sure that all people in your house wash their hands well. Have family members tested if they have a sore throat or fever. They may need an antibiotic if they have strep throat. Contact a health care provider if: You have swelling in your neck that keeps getting bigger. You develop a rash, cough, or earache. You cough up a thick mucus that is green, yellow-brown, or bloody. You have pain or discomfort that does not get better with medicine. Your symptoms seem to be getting worse. You have a fever. Get help right away if: You have new symptoms,  such as vomiting, severe headache, stiff or painful neck, chest pain, or shortness of breath. You have severe throat pain, drooling, or changes in your voice. You have swelling of the neck, or the skin on the neck becomes red and tender. You have signs of dehydration, such as tiredness (fatigue), dry mouth, and decreased urination. You become increasingly sleepy, or you cannot wake up completely. Your joints become red or painful. These symptoms may represent a serious problem that is an emergency. Do not wait to see if the symptoms will go away. Get medical help right away. Call your local emergency services (911 in the [place] Do not drive yourself to the hospital. Summary Strep throat is an infection in the throat that is caused by the Streptococcus pyogenes bacteria. This infection is spread from person to person (is contagious) through coughing, sneezing, or having close contact. Take your medicines, including antibiotics, as told by your health care provider. Do not stop taking the antibiotic even if you start to feel better. To prevent the spread of germs, wash your hands well with soap and water.  Have others do the same. Do not share food, drinking cups, or personal items. Get help right away if you have new symptoms, such as vomiting, severe headache, stiff or painful neck, chest pain, or shortness of breath. This information is not intended to replace advice given to you by your health care provider. Make sure you discuss any questions you have with your health care provider. Document Revised: 02/12/2021 Document Reviewed: 02/12/2021 Elsevier Patient Education  2023 Elsevier [employer]    If you have been instructed to have an in-person evaluation today at a local Urgent Care facility, please use the link below. It will take you to a list of all of our available [facility], including address, phone number and hours of operation. Please do not delay care.  [facility]  If you or a family member do not have a primary care provider, use the link below to schedule a visit and establish care. When you choose a [facility] primary care physician or advanced practice provider, you gain a long-term partner in health. Find a Primary Care Provider  Learn more about [facility]'s in-office and virtual care options:  - Get Care Now

## 2022-07-11 ENCOUNTER — Encounter: Payer: Self-pay | Admitting: Family Medicine

## 2022-07-16 NOTE — Progress Notes (Signed)
This encounter was created in error - please disregard.

## 2022-08-13 ENCOUNTER — Telehealth: Payer: Self-pay | Admitting: Physician Assistant

## 2022-08-13 DIAGNOSIS — J208 Acute bronchitis due to other specified organisms: Secondary | ICD-10-CM

## 2022-08-13 DIAGNOSIS — B9689 Other specified bacterial agents as the cause of diseases classified elsewhere: Secondary | ICD-10-CM

## 2022-08-13 DIAGNOSIS — J028 Acute pharyngitis due to other specified organisms: Secondary | ICD-10-CM

## 2022-08-13 MED ORDER — AZITHROMYCIN 250 MG PO TABS: ORAL_TABLET | ORAL | 0 refills | Status: AC

## 2022-08-13 MED ORDER — PSEUDOEPH-BROMPHEN-DM 30-2-10 MG/5ML PO SYRP: 5.0000 mL | ORAL_SOLUTION | Freq: Four times a day (QID) | ORAL | 0 refills | Status: DC | PRN

## 2022-08-13 MED ORDER — BENZONATATE 100 MG PO CAPS: 100.0000 mg | ORAL_CAPSULE | Freq: Three times a day (TID) | ORAL | 0 refills | Status: DC | PRN

## 2022-08-13 NOTE — Progress Notes (Signed)
Virtual Visit Consent   [name], you are scheduled for a virtual visit with a [facility] provider today. Just as with appointments in the office, your consent must be obtained to participate. Your consent will be active for this visit and any virtual visit you may have with one of our providers in the next 365 days. If you have a MyChart account, a copy of this consent can be sent to you electronically.  As this is a virtual visit, video technology does not allow for your provider to perform a traditional examination. This may limit your provider's ability to fully assess your condition. If your provider identifies any concerns that need to be evaluated in person or the need to arrange testing (such as labs, EKG, etc.), we will make arrangements to do so. Although advances in technology are sophisticated, we cannot ensure that it will always work on either your end or our end. If the connection with a video visit is poor, the visit may have to be switched to a telephone visit. With either a video or telephone visit, we are not always able to ensure that we have a secure connection.  By engaging in this virtual visit, you consent to the provision of healthcare and authorize for your insurance to be billed (if applicable) for the services provided during this visit. Depending on your insurance coverage, you may receive a charge related to this service.  I need to obtain your verbal consent now. Are you willing to proceed with your visit today? Cynthia Horton has provided verbal consent on 08/13/2022 for a virtual visit (video or telephone). Margaretann Loveless, PA-C  Date: 08/13/2022 9:24 AM  Virtual Visit via Video Note   I, Margaretann Loveless, connected with  Cynthia Horton  ([id number], 12-29-1995) on 08/13/22 at  9:15 AM EDT by a video-enabled telemedicine application and verified that I am speaking with the correct person using two identifiers.  Location: Patient: Virtual  Visit Location Patient: Mobile Provider: Virtual Visit Location Provider: Home Office   I discussed the limitations of evaluation and management by telemedicine and the availability of in person appointments. The patient expressed understanding and agreed to proceed.    History of Present Illness: Cynthia Horton is a 26 y.o. who identifies as a female who was assigned female at birth, and is being seen today for URI symptoms.  HPI: URI  This is a new problem. The current episode started in the past 7 days (Saturday; has been aropund sick contacts). The problem has been gradually worsening. Maximum temperature: subjective fever on Monday. The fever has been present for Less than 1 day. Associated symptoms include congestion, coughing, ear pain, headaches, rhinorrhea (post nasal drainage) and a sore throat. Pertinent negatives include no diarrhea, nausea, plugged ear sensation, sinus pain, vomiting or wheezing. Associated symptoms comments: Chills, mylagias. Treatments tried: Tylenol cold and flu. The treatment provided no relief.   Covid testing has been negative   Problems:  Patient Active Problem List   Diagnosis Date Noted   Vaginal discharge 10/08/2020   Strep throat 01/29/2018   Exercise-induced asthma 10/02/2014   Dysmenorrhea 10/02/2014   Anemia 10/02/2014   Bronchospasm, exercise-induced 11/24/2013    Allergies:  Allergies  Allergen Reactions   Naproxen Other (See Comments)    Chest tightness   Medications:  Current Outpatient Medications:    azithromycin (ZITHROMAX) 250 MG tablet, Take 2 tablets on day 1, then 1 tablet daily on days 2 through 5,  Disp: 6 tablet, Rfl: 0   benzonatate (TESSALON) 100 MG capsule, Take 1 capsule (100 mg total) by mouth 3 (three) times daily as needed., Disp: 30 capsule, Rfl: 0   brompheniramine-pseudoephedrine-DM 30-2-10 MG/5ML syrup, Take 5 mLs by mouth 4 (four) times daily as needed., Disp: 120 mL, Rfl: 0   albuterol (VENTOLIN HFA) 108 (90  Base) MCG/ACT inhaler, Inhale 2 puffs into the lungs every 4 (four) hours as needed for wheezing or shortness of breath., Disp: 1 each, Rfl: 0   HYDROcodone-acetaminophen (NORCO/VICODIN) 5-325 MG tablet, Take 1 tablet by mouth every 6 (six) hours as needed for severe pain., Disp: 5 tablet, Rfl: 0   pantoprazole (PROTONIX) 40 MG tablet, Take 1 tablet (40 mg total) by mouth daily before breakfast., Disp: 30 tablet, Rfl: 0  Observations/Objective: Patient is well-developed, well-nourished in no acute distress.  Resting comfortably at home.  Head is normocephalic, atraumatic.  No labored breathing.  Speech is clear and coherent with logical content.  Patient is alert and oriented at baseline.  Dry, deep, bronchial cough heard multiple times   Assessment and Plan: 1. Acute bacterial bronchitis - azithromycin (ZITHROMAX) 250 MG tablet; Take 2 tablets on day 1, then 1 tablet daily on days 2 through 5  Dispense: 6 tablet; Refill: 0 - brompheniramine-pseudoephedrine-DM 30-2-10 MG/5ML syrup; Take 5 mLs by mouth 4 (four) times daily as needed.  Dispense: 120 mL; Refill: 0 - benzonatate (TESSALON) 100 MG capsule; Take 1 capsule (100 mg total) by mouth 3 (three) times daily as needed.  Dispense: 30 capsule; Refill: 0  2. Acute bacterial pharyngitis - azithromycin (ZITHROMAX) 250 MG tablet; Take 2 tablets on day 1, then 1 tablet daily on days 2 through 5  Dispense: 6 tablet; Refill: 0  - Worsening over a week despite OTC medications - Will treat with Z-pack, Bromfed DM and tessalon perles - Can continue Mucinex  - Push fluids.  - Rest.  - Steam and humidifier can help - Seek in person evaluation if worsening or symptoms fail to improve    Follow Up Instructions: I discussed the assessment and treatment plan with the patient. The patient was provided an opportunity to ask questions and all were answered. The patient agreed with the plan and demonstrated an understanding of the instructions.  A copy  of instructions were sent to the patient via MyChart unless otherwise noted below.    The patient was advised to call back or seek an in-person evaluation if the symptoms worsen or if the condition fails to improve as anticipated.  Time:  I spent 11 minutes with the patient via telehealth technology discussing the above problems/concerns.    Mar Daring, PA-C

## 2022-08-13 NOTE — Patient Instructions (Signed)
Cynthia Horton, thank you for joining Mar Daring, PA-C for today's virtual visit.  While this provider is not your primary care provider (PCP), if your PCP is located in our provider database this encounter information will be shared with them immediately following your visit.  Consent: (Patient) Cynthia Horton provided verbal consent for this virtual visit at the beginning of the encounter.  Current Medications:  Current Outpatient Medications:    azithromycin (ZITHROMAX) 250 MG tablet, Take 2 tablets on day 1, then 1 tablet daily on days 2 through 5, Disp: 6 tablet, Rfl: 0   benzonatate (TESSALON) 100 MG capsule, Take 1 capsule (100 mg total) by mouth 3 (three) times daily as needed., Disp: 30 capsule, Rfl: 0   brompheniramine-pseudoephedrine-DM 30-2-10 MG/5ML syrup, Take 5 mLs by mouth 4 (four) times daily as needed., Disp: 120 mL, Rfl: 0   albuterol (VENTOLIN HFA) 108 (90 Base) MCG/ACT inhaler, Inhale 2 puffs into the lungs every 4 (four) hours as needed for wheezing or shortness of breath., Disp: 1 each, Rfl: 0   HYDROcodone-acetaminophen (NORCO/VICODIN) 5-325 MG tablet, Take 1 tablet by mouth every 6 (six) hours as needed for severe pain., Disp: 5 tablet, Rfl: 0   pantoprazole (PROTONIX) 40 MG tablet, Take 1 tablet (40 mg total) by mouth daily before breakfast., Disp: 30 tablet, Rfl: 0   Medications ordered in this encounter:  Meds ordered this encounter  Medications   azithromycin (ZITHROMAX) 250 MG tablet    Sig: Take 2 tablets on day 1, then 1 tablet daily on days 2 through 5    Dispense:  6 tablet    Refill:  0    Order Specific Question:   Supervising Provider    Answer:   Chase Picket [[id number]]   brompheniramine-pseudoephedrine-DM 30-2-10 MG/5ML syrup    Sig: Take 5 mLs by mouth 4 (four) times daily as needed.    Dispense:  120 mL    Refill:  0    Order Specific Question:   Supervising Provider    Answer:   Chase Picket [[id number]]   benzonatate  (TESSALON) 100 MG capsule    Sig: Take 1 capsule (100 mg total) by mouth 3 (three) times daily as needed.    Dispense:  30 capsule    Refill:  0    Order Specific Question:   Supervising Provider    Answer:   Chase Picket [id number]     *If you need refills on other medications prior to your next appointment, please contact your pharmacy*  Follow-Up: Call back or seek an in-person evaluation if the symptoms worsen or if the condition fails to improve as anticipated.  [place] [phone]  Other Instructions  Acute Bronchitis, Adult  Acute bronchitis is sudden inflammation of the main airways (bronchi) that come off the windpipe (trachea) in the lungs. The swelling causes the airways to get smaller and make more mucus than normal. This can make it hard to breathe and can cause coughing or noisy breathing (wheezing). Acute bronchitis may last several weeks. The cough may last longer. Allergies, asthma, and exposure to smoke may make the condition worse. What are the causes? This condition can be caused by germs and by substances that irritate the lungs, including: Cold and flu viruses. The most common cause of this condition is the virus that causes the common cold. Bacteria. This is less common. Breathing in substances that irritate the lungs, including: Smoke from cigarettes and other  forms of tobacco. Dust and pollen. Fumes from household cleaning products, gases, or burned fuel. Indoor or outdoor air pollution. What increases the risk? The following factors may make you more likely to develop this condition: A weak body's defense system, also called the immune system. A condition that affects your lungs and breathing, such as asthma. What are the signs or symptoms? Common symptoms of this condition include: Coughing. This may bring up clear, yellow, or green mucus from your lungs (sputum). Wheezing. Runny or stuffy nose. Having too much mucus in your  lungs (chest congestion). Shortness of breath. Aches and pains, including sore throat or chest. How is this diagnosed? This condition is usually diagnosed based on: Your symptoms and medical history. A physical exam. You may also have other tests, including tests to rule out other conditions, such as pneumonia. These tests include: A test of lung function. Test of a mucus sample to look for the presence of bacteria. Tests to check the oxygen level in your blood. Blood tests. Chest X-ray. How is this treated? Most cases of acute bronchitis clear up over time without treatment. Your health care provider may recommend: Drinking more fluids to help thin your mucus so it is easier to cough up. Taking inhaled medicine (inhaler) to improve air flow in and out of your lungs. Using a vaporizer or a humidifier. These are machines that add water to the air to help you breathe better. Taking a medicine that thins mucus and clears congestion (expectorant). Taking a medicine that prevents or stops coughing (cough suppressant). It is not common to take an antibiotic medicine for this condition. Follow these instructions at home:  Take over-the-counter and prescription medicines only as told by your health care provider. Use an inhaler, vaporizer, or humidifier as told by your health care provider. Take two teaspoons (10 mL) of honey at bedtime to lessen coughing at night. Drink enough fluid to keep your urine pale yellow. Do not use any products that contain nicotine or tobacco. These products include cigarettes, chewing tobacco, and vaping devices, such as e-cigarettes. If you need help quitting, ask your health care provider. Get plenty of rest. Return to your normal activities as told by your health care provider. Ask your health care provider what activities are safe for you. Keep all follow-up visits. This is important. How is this prevented? To lower your risk of getting this condition  again: Wash your hands often with soap and water for at least 20 seconds. If soap and water are not available, use hand sanitizer. Avoid contact with people who have cold symptoms. Try not to touch your mouth, nose, or eyes with your hands. Avoid breathing in smoke or chemical fumes. Breathing smoke or chemical fumes will make your condition worse. Get the flu shot every year. Contact a health care provider if: Your symptoms do not improve after 2 weeks. You have trouble coughing up the mucus. Your cough keeps you awake at night. You have a fever. Get help right away if you: Cough up blood. Feel pain in your chest. Have severe shortness of breath. Faint or keep feeling like you are going to faint. Have a severe headache. Have a fever or chills that get worse. These symptoms may represent a serious problem that is an emergency. Do not wait to see if the symptoms will go away. Get medical help right away. Call your local emergency services (911 in the [place] Do not drive yourself to the hospital. Summary Acute bronchitis is  inflammation of the main airways (bronchi) that come off the windpipe (trachea) in the lungs. The swelling causes the airways to get smaller and make more mucus than normal. Drinking more fluids can help thin your mucus so it is easier to cough up. Take over-the-counter and prescription medicines only as told by your health care provider. Do not use any products that contain nicotine or tobacco. These products include cigarettes, chewing tobacco, and vaping devices, such as e-cigarettes. If you need help quitting, ask your health care provider. Contact a health care provider if your symptoms do not improve after 2 weeks. This information is not intended to replace advice given to you by your health care provider. Make sure you discuss any questions you have with your health care provider. Document Revised: 01/30/2022 Document Reviewed: 02/20/2021 Elsevier Patient  Education  2023 Elsevier Inc.    If you have been instructed to have an in-person evaluation today at a local Urgent Care facility, please use the link below. It will take you to a list of all of our available [facility], including address, phone number and hours of operation. Please do not delay care.  [facility]  If you or a family member do not have a primary care provider, use the link below to schedule a visit and establish care. When you choose a [facility] primary care physician or advanced practice provider, you gain a long-term partner in health. Find a Primary Care Provider  Learn more about [facility]'s in-office and virtual care options: [facility] - Get Care Now

## 2023-04-30 ENCOUNTER — Ambulatory Visit: Payer: Self-pay | Admitting: Family Medicine

## 2023-08-23 IMAGING — DX DG KNEE COMPLETE 4+V*L*
4 series · 4 of 4 positions shown · non-contrast
Comparison: None.

CLINICAL DATA: Increasing left knee pain for 3 days.

EXAM:
LEFT KNEE - COMPLETE 4+ VIEW

[knee ap]
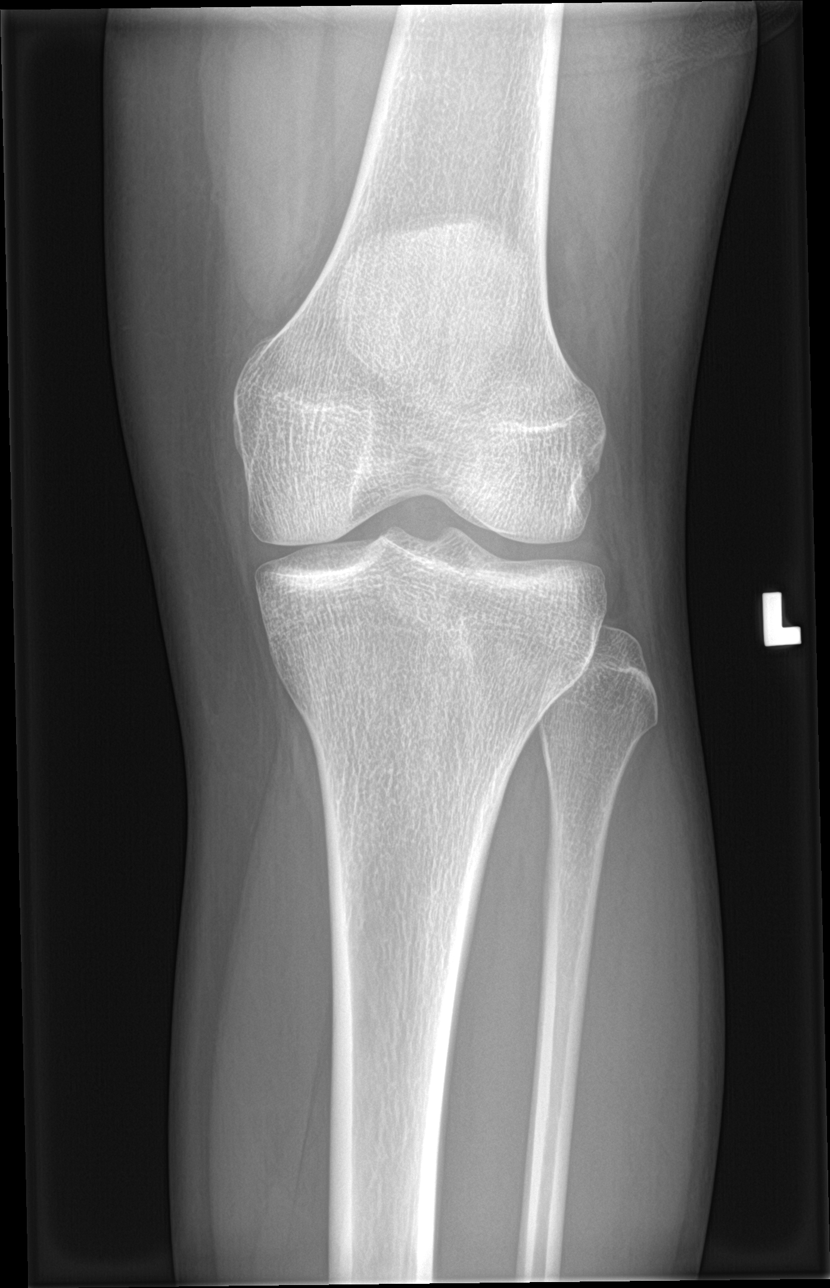

[knee obl (1 of 2)]
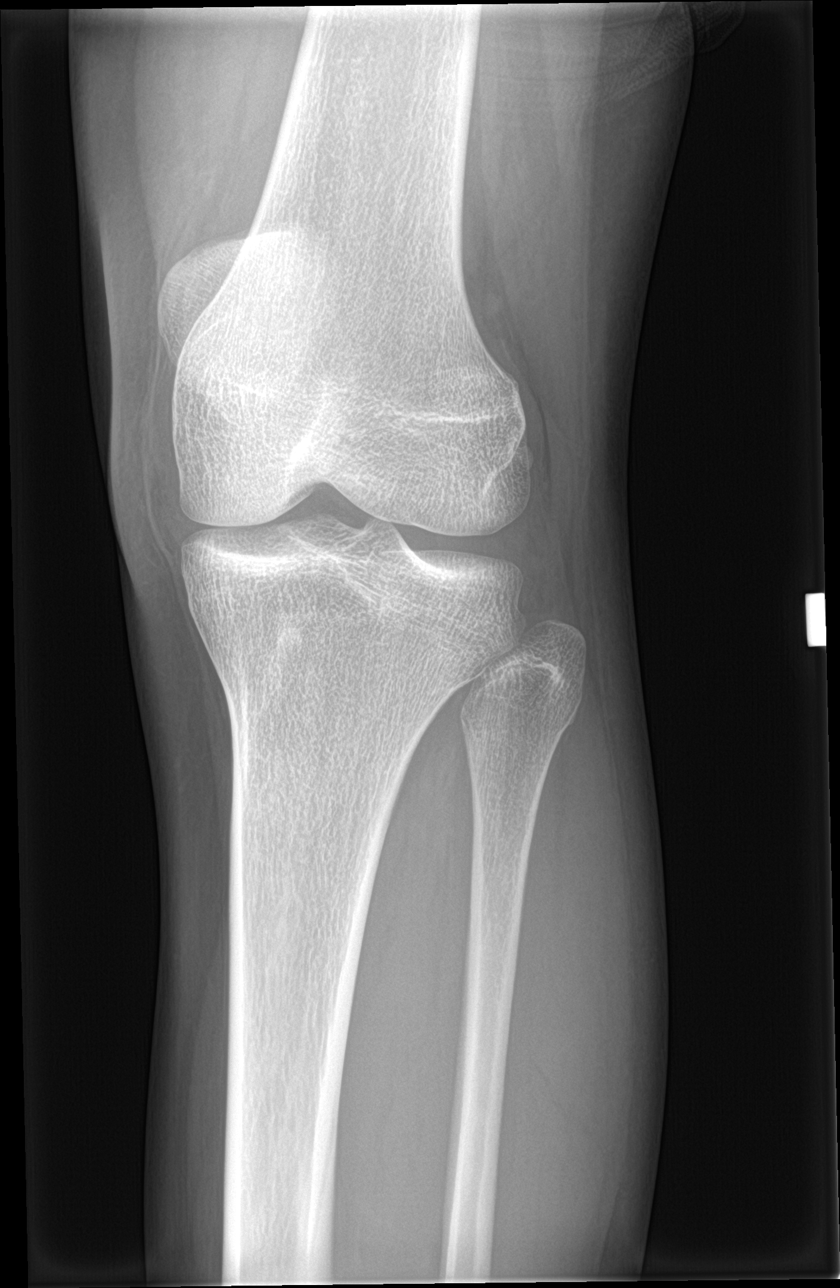

[knee obl (2 of 2)]
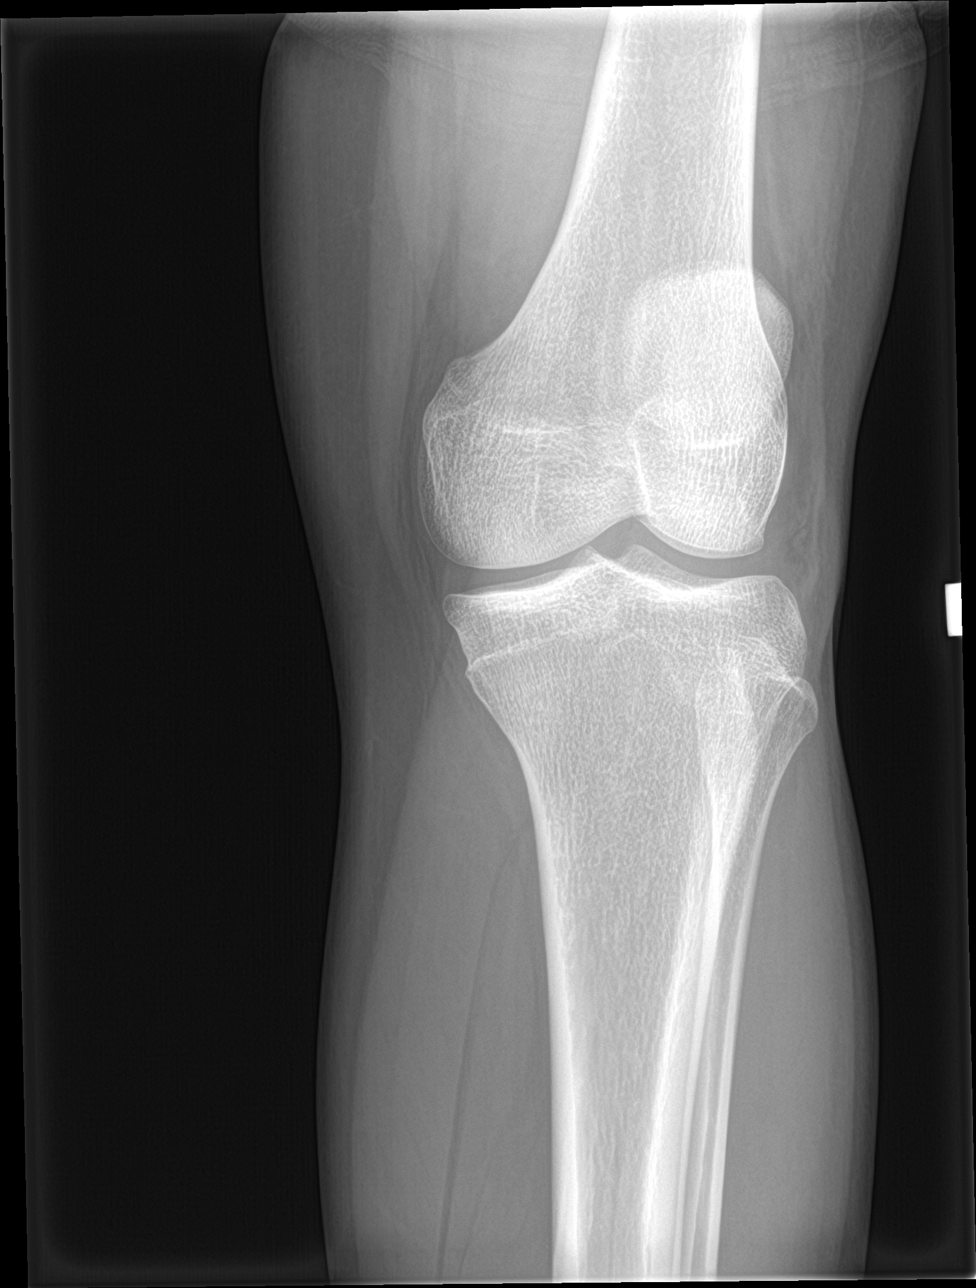

[knee lat]
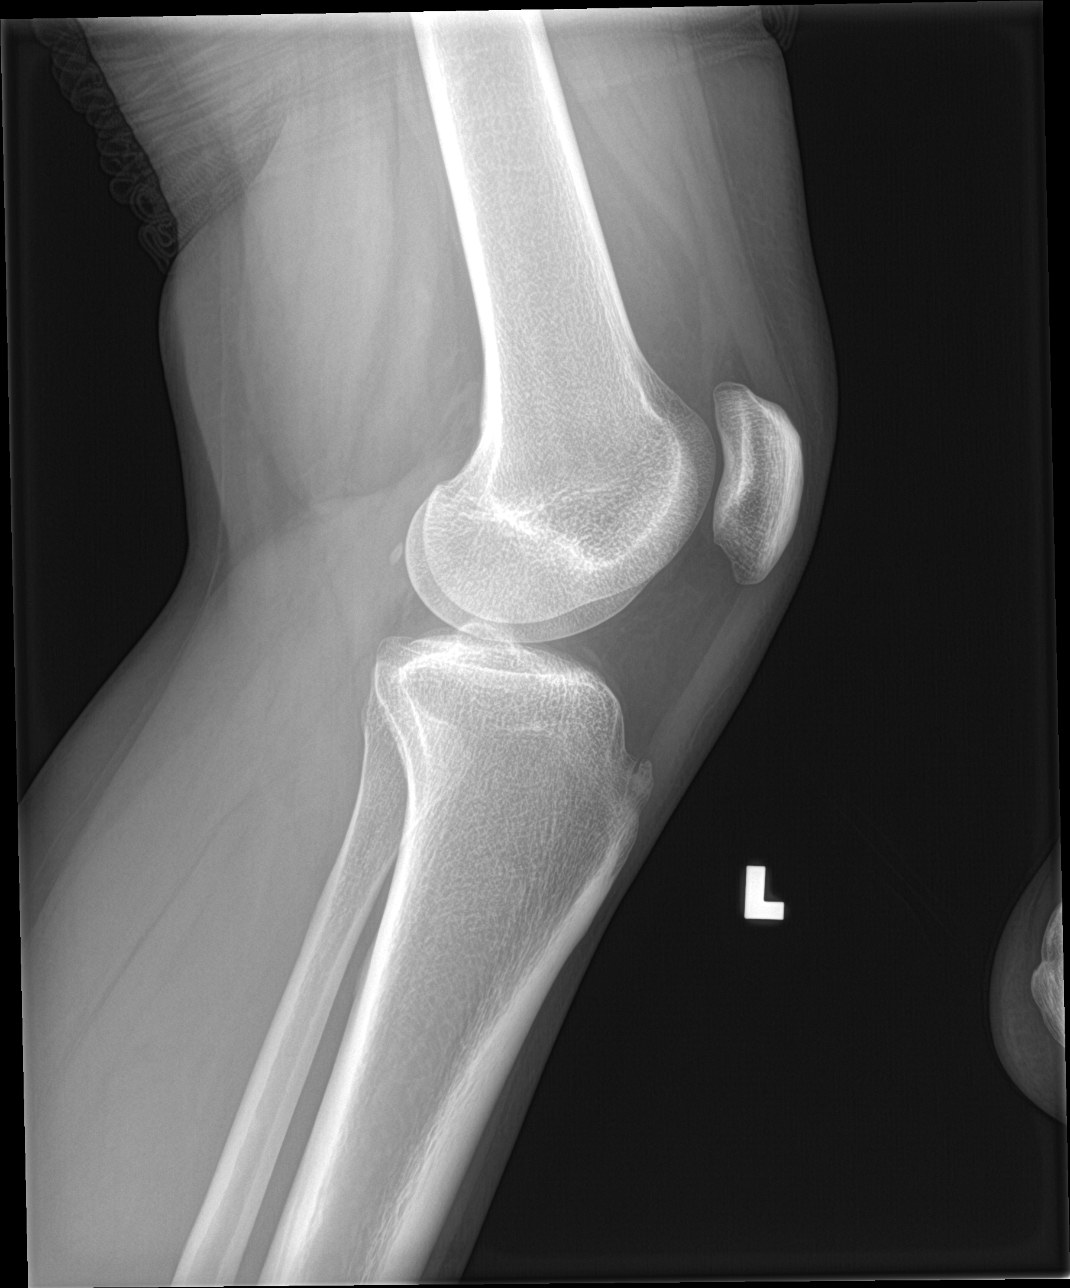

[4 of 4 positions shown; findings below may reference images not displayed]

FINDINGS: No evidence of fracture, dislocation, or joint effusion. No evidence
of arthropathy or other focal bone abnormality. Soft tissues are
unremarkable.
IMPRESSION: Negative.

## 2024-02-08 ENCOUNTER — Encounter: Payer: Self-pay | Admitting: Physician Assistant

## 2024-02-08 ENCOUNTER — Telehealth: Payer: Self-pay | Admitting: Physician Assistant

## 2024-02-08 DIAGNOSIS — R6889 Other general symptoms and signs: Secondary | ICD-10-CM

## 2024-02-08 MED ORDER — OSELTAMIVIR PHOSPHATE 75 MG PO CAPS: 75.0000 mg | ORAL_CAPSULE | Freq: Two times a day (BID) | ORAL | 0 refills | Status: DC

## 2024-02-08 NOTE — Progress Notes (Signed)
 Patient has a video appt. Will mark erroneous.

## 2024-02-08 NOTE — Progress Notes (Signed)
 Virtual Visit Consent   [name], you are scheduled for a virtual visit with a [facility] provider today. Just as with appointments in the office, your consent must be obtained to participate. Your consent will be active for this visit and any virtual visit you may have with one of our providers in the next 365 days. If you have a MyChart account, a copy of this consent can be sent to you electronically.  As this is a virtual visit, video technology does not allow for your provider to perform a traditional examination. This may limit your provider's ability to fully assess your condition. If your provider identifies any concerns that need to be evaluated in person or the need to arrange testing (such as labs, EKG, etc.), we will make arrangements to do so. Although advances in technology are sophisticated, we cannot ensure that it will always work on either your end or our end. If the connection with a video visit is poor, the visit may have to be switched to a telephone visit. With either a video or telephone visit, we are not always able to ensure that we have a secure connection.  By engaging in this virtual visit, you consent to the provision of healthcare and authorize for your insurance to be billed (if applicable) for the services provided during this visit. Depending on your insurance coverage, you may receive a charge related to this service.  I need to obtain your verbal consent now. Are you willing to proceed with your visit today? Cynthia Horton has provided verbal consent on 02/08/2024 for a virtual visit (video or telephone). Margaretann Loveless, PA-C  Date: 02/08/2024 6:17 PM   Virtual Visit via Video Note   I, Margaretann Loveless, connected with  Cynthia Horton  ([id number], May 15, 1996) on 02/08/24 at  6:15 PM EDT by a video-enabled telemedicine application and verified that I am speaking with the correct person using two identifiers.  Location: Patient: Virtual  Visit Location Patient: Home Provider: Virtual Visit Location Provider: Home Office   I discussed the limitations of evaluation and management by telemedicine and the availability of in person appointments. The patient expressed understanding and agreed to proceed.    History of Present Illness: Cynthia Horton is a 28 y.o. who identifies as a female who was assigned female at birth, and is being seen today for flu-like symptoms.  HPI: URI  This is a new problem. The current episode started in the past 7 days (2 days). The problem has been unchanged. The maximum temperature recorded prior to her arrival was 103 - 104 F. The fever has been present for Less than 1 day. Associated symptoms include congestion, coughing (mild; dry), diarrhea, headaches, rhinorrhea (and post nasal drainage), a sore throat and vomiting (this morning). Pertinent negatives include no ear pain, nausea, plugged ear sensation, sinus pain, swollen glands or wheezing. Associated symptoms comments: Chills, sweats, body aches, tires easily . Treatments tried: cold showers, tylenol arthritis, nyquil, theraflu. The treatment provided no relief.  Has had some sick contacts at work.    Problems:  Patient Active Problem List   Diagnosis Date Noted   Vaginal discharge 10/08/2020   Strep throat 01/29/2018   Exercise-induced asthma 10/02/2014   Dysmenorrhea 10/02/2014   Anemia 10/02/2014   Bronchospasm, exercise-induced 11/24/2013    Allergies:  Allergies  Allergen Reactions   Naproxen Other (See Comments)    Chest tightness   Medications:  Current Outpatient Medications:    oseltamivir (  TAMIFLU) 75 MG capsule, Take 1 capsule (75 mg total) by mouth 2 (two) times daily., Disp: 10 capsule, Rfl: 0   albuterol (VENTOLIN HFA) 108 (90 Base) MCG/ACT inhaler, Inhale 2 puffs into the lungs every 4 (four) hours as needed for wheezing or shortness of breath., Disp: 1 each, Rfl: 0   benzonatate (TESSALON) 100 MG capsule, Take 1  capsule (100 mg total) by mouth 3 (three) times daily as needed., Disp: 30 capsule, Rfl: 0   brompheniramine-pseudoephedrine-DM 30-2-10 MG/5ML syrup, Take 5 mLs by mouth 4 (four) times daily as needed., Disp: 120 mL, Rfl: 0   HYDROcodone-acetaminophen (NORCO/VICODIN) 5-325 MG tablet, Take 1 tablet by mouth every 6 (six) hours as needed for severe pain., Disp: 5 tablet, Rfl: 0   pantoprazole (PROTONIX) 40 MG tablet, Take 1 tablet (40 mg total) by mouth daily before breakfast., Disp: 30 tablet, Rfl: 0  Observations/Objective: Patient is well-developed, well-nourished in no acute distress.  Resting comfortably at home.  Head is normocephalic, atraumatic.  No labored breathing.  Speech is clear and coherent with logical content.  Patient is alert and oriented at baseline.    Assessment and Plan: 1. Flu-like symptoms (Primary) - oseltamivir (TAMIFLU) 75 MG capsule; Take 1 capsule (75 mg total) by mouth 2 (two) times daily.  Dispense: 10 capsule; Refill: 0  - Suspect influenza due to symptoms and positive exposure - Tamiflu prescribed - Tessalon perles for cough - Continue OTC medication of choice for symptomatic management - Push fluids - Rest - Work note provided - Seek in person evaluation if symptoms worsen or fail to improve   Follow Up Instructions: I discussed the assessment and treatment plan with the patient. The patient was provided an opportunity to ask questions and all were answered. The patient agreed with the plan and demonstrated an understanding of the instructions.  A copy of instructions were sent to the patient via MyChart unless otherwise noted below.    The patient was advised to call back or seek an in-person evaluation if the symptoms worsen or if the condition fails to improve as anticipated.    Margaretann Loveless, PA-C

## 2024-02-08 NOTE — Patient Instructions (Signed)
 Cynthia Horton, thank you for joining Margaretann Loveless, PA-C for today's virtual visit.  While this provider is not your primary care provider (PCP), if your PCP is located in our provider database this encounter information will be shared with them immediately following your visit.   A [facility] MyChart account gives you access to today's visit and all your visits, tests, and labs performed at [facility] " click here if you don't have a [facility] MyChart account or go to [email or web address]  Consent: (Patient) Cynthia Horton provided verbal consent for this virtual visit at the beginning of the encounter.  Current Medications:  Current Outpatient Medications:    oseltamivir (TAMIFLU) 75 MG capsule, Take 1 capsule (75 mg total) by mouth 2 (two) times daily., Disp: 10 capsule, Rfl: 0   albuterol (VENTOLIN HFA) 108 (90 Base) MCG/ACT inhaler, Inhale 2 puffs into the lungs every 4 (four) hours as needed for wheezing or shortness of breath., Disp: 1 each, Rfl: 0   benzonatate (TESSALON) 100 MG capsule, Take 1 capsule (100 mg total) by mouth 3 (three) times daily as needed., Disp: 30 capsule, Rfl: 0   brompheniramine-pseudoephedrine-DM 30-2-10 MG/5ML syrup, Take 5 mLs by mouth 4 (four) times daily as needed., Disp: 120 mL, Rfl: 0   HYDROcodone-acetaminophen (NORCO/VICODIN) 5-325 MG tablet, Take 1 tablet by mouth every 6 (six) hours as needed for severe pain., Disp: 5 tablet, Rfl: 0   pantoprazole (PROTONIX) 40 MG tablet, Take 1 tablet (40 mg total) by mouth daily before breakfast., Disp: 30 tablet, Rfl: 0   Medications ordered in this encounter:  Meds ordered this encounter  Medications   oseltamivir (TAMIFLU) 75 MG capsule    Sig: Take 1 capsule (75 mg total) by mouth 2 (two) times daily.    Dispense:  10 capsule    Refill:  0    Supervising Provider:   Merrilee Jansky [[id number]]     *If you need refills on other medications prior to your next appointment,  please contact your pharmacy*  Follow-Up: Call back or seek an in-person evaluation if the symptoms worsen or if the condition fails to improve as anticipated.  [facility] [phone]  Other Instructions Influenza, Adult Influenza is also called the flu. It's an infection that affects your respiratory tract. This includes your nose, throat, windpipe, and lungs. The flu is contagious. This means it spreads easily from person to person. It causes symptoms that are like a cold. It can also cause a high fever and body aches. What are the causes? The flu is caused by the influenza virus. You can get it by: Breathing in droplets that are in the air after an infected person coughs or sneezes. Touching something that has the virus on it and then touching your mouth, nose, or eyes. What increases the risk? You may be more likely to get the flu if: You don't wash your hands often. You're near a lot of people during cold and flu season. You touch your mouth, eyes, or nose without washing your hands first. You don't get a flu shot each year. You may also be more at risk for the flu and serious problems, such as a lung infection called pneumonia, if: You're older than 65. You're pregnant. Your immune system is weak. Your immune system is your body's defense system. You have a long-term, or chronic, condition, such as: Heart, kidney, or lung disease. Diabetes. A liver disorder. Asthma. You're very overweight. You have  anemia. This is when you don't have enough red blood cells in your body. What are the signs or symptoms? Flu symptoms often start all of a sudden. They may last 4-14 days and include: Fever and chills. Headaches, body aches, or muscle aches. Sore throat. Cough. Runny or stuffy nose. Discomfort in your chest. Not wanting to eat as much as normal. Feeling weak or tired. Feeling dizzy. Nausea or vomiting. How is this diagnosed? The flu may be diagnosed based  on your symptoms and medical history. You may also have a physical exam. A swab may be taken from your nose or throat and tested for the virus. How is this treated? If the flu is found early, you can be treated with antiviral medicine. This may be given to you by mouth or through an IV. It can help you feel less sick and get better faster. Taking care of yourself at home can also help your symptoms get better. Your health care provider may tell you to: Take over-the-counter medicines. Drink lots of fluids. The flu often goes away on its own. If you have very bad symptoms or problems caused by the flu, you may need to be treated in a hospital. Follow these instructions at home: Activity Rest as needed. Get lots of sleep. Stay home from work or school as told by your provider. Leave home only to go see your provider. Do not leave home for other reasons until you don't have a fever for 24 hours without taking medicine. Eating and drinking Take an oral rehydration solution (ORS). This is a drink that is sold at pharmacies and stores. Drink enough fluid to keep your pee pale yellow. Try to drink small amounts of clear fluids. These include water, ice chips, fruit juice mixed with water, and low-calorie sports drinks. Try to eat bland foods that are easy to digest. These include bananas, applesauce, rice, lean meats, toast, and crackers. Avoid drinks that have a lot of sugar or caffeine in them. These include energy drinks, regular sports drinks, and soda. Do not drink alcohol. Do not eat spicy or fatty foods. General instructions     Take your medicines only as told by your provider. Use a cool mist humidifier to add moisture to the air in your home. This can make it easier for you to breathe. You should also clean the humidifier every day. To do so: Empty the water. Pour clean water in. Cover your mouth and nose when you cough or sneeze. Wash your hands with soap and water often and for at  least 20 seconds. It's extra important to do so after you cough or sneeze. If you can't use soap and water, use hand sanitizer. How is this prevented?  Get a flu shot every year. Ask your provider when you should get your flu shot. Stay away from people who are sick during fall and winter. Fall and winter are cold and flu season. Contact a health care provider if: You get new symptoms. You have chest pain. You have watery poop, also called diarrhea. You have a fever. Your cough gets worse. You start to have more mucus. You feel like you may vomit, or you vomit. Get help right away if: You become short of breath or have trouble breathing. Your skin or nails turn blue. You have very bad pain or stiffness in your neck. You get a sudden headache or pain in your face or ear. You vomit each time you eat or drink. These symptoms  may be an emergency. Call 911 right away. Do not wait to see if the symptoms will go away. Do not drive yourself to the hospital. This information is not intended to replace advice given to you by your health care provider. Make sure you discuss any questions you have with your health care provider. Document Revised: 07/23/2023 Document Reviewed: 11/27/2022 Elsevier Patient Education  2024 Elsevier Inc.   If you have been instructed to have an in-person evaluation today at a local Urgent Care facility, please use the link below. It will take you to a list of all of our available [facility], including address, phone number and hours of operation. Please do not delay care.  [facility]  If you or a family member do not have a primary care provider, use the link below to schedule a visit and establish care. When you choose a [facility] primary care physician or advanced practice provider, you gain a long-term partner in health. Find a Primary Care Provider  Learn more about [facility]'s in-office and virtual care options: [facility] -  Get Care Now

## 2024-02-08 NOTE — Telephone Encounter (Signed)
 This encounter was created in error - please disregard.

## 2024-02-10 ENCOUNTER — Other Ambulatory Visit: Payer: Self-pay | Admitting: Internal Medicine

## 2024-04-05 ENCOUNTER — Telehealth: Payer: Self-pay | Admitting: Physician Assistant

## 2024-04-05 DIAGNOSIS — R0981 Nasal congestion: Secondary | ICD-10-CM

## 2024-04-05 DIAGNOSIS — R079 Chest pain, unspecified: Secondary | ICD-10-CM

## 2024-04-05 DIAGNOSIS — R0602 Shortness of breath: Secondary | ICD-10-CM

## 2024-04-05 DIAGNOSIS — R519 Headache, unspecified: Secondary | ICD-10-CM

## 2024-04-05 DIAGNOSIS — R6889 Other general symptoms and signs: Secondary | ICD-10-CM

## 2024-04-05 MED ORDER — PREDNISONE 20 MG PO TABS: 40.0000 mg | ORAL_TABLET | Freq: Every day | ORAL | 0 refills | Status: AC

## 2024-04-05 NOTE — Progress Notes (Signed)
  Because you are having shortness of breath and chest pain with flu-like symptoms, I feel your condition warrants further evaluation and I recommend that you be seen in a face-to-face visit to be evaluated for Pneumonia.   NOTE: There will be NO CHARGE for this E-Visit   If you are having a true medical emergency, please call 911.     For an urgent face to face visit, [facility] has multiple urgent care centers for your convenience.  Click the link below for the full list of locations and hours, walk-in wait times, appointment scheduling options and driving directions:  Urgent Care - [place], [place], [place], [place], [place], [place]  [place]     Your MyChart E-visit questionnaire answers were reviewed by a board certified advanced clinical practitioner to complete your personal care plan based on your specific symptoms.    Thank you for using e-Visits.     I have spent 5 minutes in review of e-visit questionnaire, review and updating patient chart, medical decision making and response to patient.   Angelia Kelp, PA-C

## 2024-04-05 NOTE — Progress Notes (Signed)
 Virtual Visit Consent   Cynthia Horton, you are scheduled for a virtual visit with a [facility] provider today. Just as with appointments in the office, your consent must be obtained to participate. Your consent will be active for this visit and any virtual visit you may have with one of our providers in the next 365 days. If you have a MyChart account, a copy of this consent can be sent to you electronically.  As this is a virtual visit, video technology does not allow for your provider to perform a traditional examination. This may limit your provider's ability to fully assess your condition. If your provider identifies any concerns that need to be evaluated in person or the need to arrange testing (such as labs, EKG, etc.), we will make arrangements to do so. Although advances in technology are sophisticated, we cannot ensure that it will always work on either your end or our end. If the connection with a video visit is poor, the visit may have to be switched to a telephone visit. With either a video or telephone visit, we are not always able to ensure that we have a secure connection.  By engaging in this virtual visit, you consent to the provision of healthcare and authorize for your insurance to be billed (if applicable) for the services provided during this visit. Depending on your insurance coverage, you may receive a charge related to this service.  I need to obtain your verbal consent now. Are you willing to proceed with your visit today? Cynthia Horton has provided verbal consent on 04/05/2024 for a virtual visit (video or telephone). Angelia Kelp, PA-C  Date: 04/05/2024 10:27 AM   Virtual Visit via Video Note   I, Angelia Kelp, connected with  Cynthia Horton  ([id number], 08/18/1996) on 04/05/24 at 10:15 AM EDT by a video-enabled telemedicine application and verified that I am speaking with the correct person using two identifiers.  Location: Patient: Virtual  Visit Location Patient: Home Provider: Virtual Visit Location Provider: Home Office   I discussed the limitations of evaluation and management by telemedicine and the availability of in person appointments. The patient expressed understanding and agreed to proceed.    History of Present Illness: Cynthia Horton is a 28 y.o. who identifies as a female who was assigned female at birth, and is being seen today for sinus headache.  HPI: URI  This is a new problem. The current episode started in the past 7 days (Saturday night, 04/02/24). The problem has been gradually worsening. There has been no fever. Associated symptoms include congestion, coughing (mild, dry), headaches and a plugged ear sensation (Sunday, now improved). Pertinent negatives include no chest pain, diarrhea, ear pain, nausea, rhinorrhea, sinus pain, sore throat, swollen glands, vomiting or wheezing. She has tried decongestant and acetaminophen  (Sudafed) for the symptoms. The treatment provided mild relief.     Problems:  Patient Active Problem List   Diagnosis Date Noted   Vaginal discharge 10/08/2020   Strep throat 01/29/2018   Exercise-induced asthma 10/02/2014   Dysmenorrhea 10/02/2014   Anemia 10/02/2014   Bronchospasm, exercise-induced 11/24/2013    Allergies:  Allergies  Allergen Reactions   Naproxen Other (See Comments)    Chest tightness   Medications:  Current Outpatient Medications:    predniSONE  (DELTASONE ) 20 MG tablet, Take 2 tablets (40 mg total) by mouth daily with breakfast., Disp: 10 tablet, Rfl: 0   pantoprazole  (PROTONIX ) 40 MG tablet, Take 1 tablet (40 mg total)  by mouth daily before breakfast., Disp: 30 tablet, Rfl: 0  Observations/Objective: Patient is well-developed, well-nourished in no acute distress.  Resting comfortably at home.  Head is normocephalic, atraumatic.  No labored breathing.  Speech is clear and coherent with logical content.  Patient is alert and oriented at baseline.     Assessment and Plan: 1. Sinus congestion (Primary) - predniSONE  (DELTASONE ) 20 MG tablet; Take 2 tablets (40 mg total) by mouth daily with breakfast.  Dispense: 10 tablet; Refill: 0  2. Acute nonintractable headache, unspecified headache type - predniSONE  (DELTASONE ) 20 MG tablet; Take 2 tablets (40 mg total) by mouth daily with breakfast.  Dispense: 10 tablet; Refill: 0  - Suspect viral URI, possibly Covid, can test if desired - Symptomatic medications of choice over the counter as needed - Prednisone  added for headache and congestion - Push fluids - Rest - Seek further evaluation if symptoms change or worsen   Follow Up Instructions: I discussed the assessment and treatment plan with the patient. The patient was provided an opportunity to ask questions and all were answered. The patient agreed with the plan and demonstrated an understanding of the instructions.  A copy of instructions were sent to the patient via MyChart unless otherwise noted below.    The patient was advised to call back or seek an in-person evaluation if the symptoms worsen or if the condition fails to improve as anticipated.    Angelia Kelp, PA-C

## 2024-04-05 NOTE — Patient Instructions (Signed)
 Cynthia Horton, thank you for joining Angelia Kelp, PA-C for today's virtual visit.  While this provider is not your primary care provider (PCP), if your PCP is located in our provider database this encounter information will be shared with them immediately following your visit.   A [facility] MyChart account gives you access to today's visit and all your visits, tests, and labs performed at [facility] " click here if you don't have a [facility] MyChart account or go to [email or web address]  Consent: (Patient) Cynthia Horton provided verbal consent for this virtual visit at the beginning of the encounter.  Current Medications:  Current Outpatient Medications:    predniSONE  (DELTASONE ) 20 MG tablet, Take 2 tablets (40 mg total) by mouth daily with breakfast., Disp: 10 tablet, Rfl: 0   pantoprazole  (PROTONIX ) 40 MG tablet, Take 1 tablet (40 mg total) by mouth daily before breakfast., Disp: 30 tablet, Rfl: 0   Medications ordered in this encounter:  Meds ordered this encounter  Medications   predniSONE  (DELTASONE ) 20 MG tablet    Sig: Take 2 tablets (40 mg total) by mouth daily with breakfast.    Dispense:  10 tablet    Refill:  0    Supervising Provider:   Corine Dice [[id number]]     *If you need refills on other medications prior to your next appointment, please contact your pharmacy*  Follow-Up: Call back or seek an in-person evaluation if the symptoms worsen or if the condition fails to improve as anticipated.  [facility] [phone]  Other Instructions Sinus Pain  Sinus pain may occur when your sinuses become clogged or swollen. Sinuses are air-filled spaces in your skull that are behind the bones of your face and forehead. Sinus pain can range from mild to severe. What are the causes? Sinus pain can result from various conditions that affect the sinuses. Common causes include: Colds. Sinus  infections. Allergies. What are the signs or symptoms? The main symptom of this condition is pain or pressure in your face, forehead, ears, or upper teeth. People who have sinus pain often have other symptoms, such as: Congested or runny nose. Fever. Inability to smell. Headache. Weather changes can make symptoms worse. How is this diagnosed? Your health care provider will diagnose this condition based on your symptoms and a physical exam. If you have pain that keeps coming back or does not go away, your health care provider may recommend more testing. This may include: Imaging tests, such as a CT scan or MRI, to check for problems with your sinuses. Examination of your sinuses using a thin tool with a camera that is inserted through your nose (endoscopy). How is this treated? Treatment for this condition depends on the cause. Sinus pain that is caused by a sinus infection may be treated with antibiotic medicine. Sinus pain that is caused by congestion may be helped by rinsing out (flushing) the nose and sinuses with saline solution. Sinus pain that is caused by allergies may be helped by allergy medicines (antihistamines) and medicated nasal sprays. Sinus surgery may be needed in some cases if other treatments do not help. Follow these instructions at home: General instructions If directed: Apply a warm, moist washcloth to your face to help relieve pain. Use a nasal saline wash. Follow the directions on the bottle or box. Hydrate and humidify Drink enough water to keep your urine clear or pale yellow. Staying hydrated will help to thin your mucus. Use a  humidifier if your home is dry. Inhale steam for 10-15 minutes, 3-4 times a day or as told by your health care provider. You can do this in the bathroom while a hot shower is running. Limit your exposure to cool or dry air. Medicines  Take over-the-counter and prescription medicines only as told by your health care provider. If you  were prescribed an antibiotic medicine, take it as told by your health care provider. Do not stop taking the antibiotic even if you start to feel better. If you have congestion, use a nasal spray to help lessen pressure. Contact a health care provider if: You have sinus pain more than one time a week. You have sensitivity to light or sound. You develop a fever. You feel nauseous or you vomit. Your sinus pain or headache does not get better with treatment. Get help right away if: You have vision problems. You have sudden, severe pain in your face or head. You have a seizure. You are confused. You have a stiff neck. Summary Sinus pain occurs when your sinuses become clogged or swollen. Sinus pain can result from various conditions that affect the sinuses, such as a cold, a sinus infection, or an allergy. Treatment for this condition depends on the cause. It may include medicine, such as antibiotics or antihistamines. This information is not intended to replace advice given to you by your health care provider. Make sure you discuss any questions you have with your health care provider. Document Revised: 09/22/2021 Document Reviewed: 09/22/2021 Elsevier Patient Education  2024 [employer]   If you have been instructed to have an in-person evaluation today at a local Urgent Care facility, please use the link below. It will take you to a list of all of our available [facility], including address, phone number and hours of operation. Please do not delay care.  [facility]  If you or a family member do not have a primary care provider, use the link below to schedule a visit and establish care. When you choose a [facility] primary care physician or advanced practice provider, you gain a long-term partner in health. Find a Primary Care Provider  Learn more about [facility] in-office and virtual care options:  - Get Care Now

## 2024-07-11 ENCOUNTER — Encounter: Payer: Self-pay | Admitting: Family Medicine

## 2024-11-21 ENCOUNTER — Encounter: Payer: Self-pay | Admitting: Family Medicine
